# Patient Record
Sex: Female | Born: 1985 | Race: Black or African American | Hispanic: No | Marital: Single | Smoking: Current every day smoker
Health system: Southern US, Community
[De-identification: ages and names within clinical notes are randomized; demographics above are authoritative.]

## PROBLEM LIST (undated history)

## (undated) ENCOUNTER — Emergency Department (HOSPITAL_BASED_OUTPATIENT_CLINIC_OR_DEPARTMENT_OTHER)
Admission: EM | Disposition: A | Discharge status: Home / Self Care | Payer: No Typology Code available for payment source | Source: Home / Self Care

## (undated) HISTORY — PX: TUBAL LIGATION: SHX77

---

## 2010-05-23 ENCOUNTER — Emergency Department (HOSPITAL_BASED_OUTPATIENT_CLINIC_OR_DEPARTMENT_OTHER): Admission: EM | Admit: 2010-05-23 | Discharge: 2010-05-23 | Payer: Self-pay | Admitting: Emergency Medicine

## 2010-06-25 ENCOUNTER — Emergency Department (HOSPITAL_BASED_OUTPATIENT_CLINIC_OR_DEPARTMENT_OTHER): Admission: EM | Admit: 2010-06-25 | Discharge: 2010-06-26 | Payer: Self-pay | Admitting: Emergency Medicine

## 2010-07-06 ENCOUNTER — Emergency Department (HOSPITAL_BASED_OUTPATIENT_CLINIC_OR_DEPARTMENT_OTHER): Admission: EM | Admit: 2010-07-06 | Discharge: 2010-07-06 | Payer: Self-pay | Admitting: Emergency Medicine

## 2010-08-15 ENCOUNTER — Emergency Department (HOSPITAL_BASED_OUTPATIENT_CLINIC_OR_DEPARTMENT_OTHER): Admission: EM | Admit: 2010-08-15 | Discharge: 2010-08-15 | Payer: Self-pay | Admitting: Emergency Medicine

## 2010-10-28 ENCOUNTER — Emergency Department (HOSPITAL_BASED_OUTPATIENT_CLINIC_OR_DEPARTMENT_OTHER)
Admission: EM | Admit: 2010-10-28 | Discharge: 2010-10-28 | Payer: Self-pay | Source: Home / Self Care | Admitting: Emergency Medicine

## 2010-11-04 LAB — URINALYSIS, ROUTINE W REFLEX MICROSCOPIC
Bilirubin Urine: NEGATIVE
Hgb urine dipstick: NEGATIVE
Ketones, ur: NEGATIVE mg/dL
Nitrite: NEGATIVE
Protein, ur: NEGATIVE mg/dL
Specific Gravity, Urine: 1.024 (ref 1.005–1.030)
Urine Glucose, Fasting: NEGATIVE mg/dL
Urobilinogen, UA: 1 mg/dL (ref 0.0–1.0)
pH: 7.5 (ref 5.0–8.0)

## 2010-11-04 LAB — RPR: RPR Ser Ql: NONREACTIVE

## 2010-11-04 LAB — URINE MICROSCOPIC-ADD ON

## 2010-11-04 LAB — WET PREP, GENITAL
Trich, Wet Prep: NONE SEEN
Yeast Wet Prep HPF POC: NONE SEEN

## 2010-11-04 LAB — GC/CHLAMYDIA PROBE AMP, GENITAL
Chlamydia, DNA Probe: NEGATIVE
GC Probe Amp, Genital: NEGATIVE

## 2010-11-04 LAB — PREGNANCY, URINE: Preg Test, Ur: NEGATIVE

## 2011-01-01 LAB — URINALYSIS, ROUTINE W REFLEX MICROSCOPIC
Bilirubin Urine: NEGATIVE
Glucose, UA: NEGATIVE mg/dL
Hgb urine dipstick: NEGATIVE
Ketones, ur: NEGATIVE mg/dL
Nitrite: NEGATIVE
Protein, ur: NEGATIVE mg/dL
Specific Gravity, Urine: 1.027 (ref 1.005–1.030)
Urobilinogen, UA: 1 mg/dL (ref 0.0–1.0)
pH: 6.5 (ref 5.0–8.0)

## 2011-01-01 LAB — URINE CULTURE
Colony Count: NO GROWTH
Culture  Setup Time: 201110280732
Culture: NO GROWTH

## 2011-01-01 LAB — GC/CHLAMYDIA PROBE AMP, GENITAL
Chlamydia, DNA Probe: NEGATIVE
GC Probe Amp, Genital: NEGATIVE

## 2011-01-01 LAB — WET PREP, GENITAL
Trich, Wet Prep: NONE SEEN
Yeast Wet Prep HPF POC: NONE SEEN

## 2011-01-01 LAB — URINE MICROSCOPIC-ADD ON

## 2011-01-01 LAB — PREGNANCY, URINE: Preg Test, Ur: NEGATIVE

## 2011-01-02 LAB — GC/CHLAMYDIA PROBE AMP, GENITAL
Chlamydia, DNA Probe: NEGATIVE
Chlamydia, DNA Probe: NEGATIVE
GC Probe Amp, Genital: NEGATIVE
GC Probe Amp, Genital: NEGATIVE

## 2011-01-02 LAB — URINALYSIS, ROUTINE W REFLEX MICROSCOPIC
Bilirubin Urine: NEGATIVE
Bilirubin Urine: NEGATIVE
Glucose, UA: NEGATIVE mg/dL
Glucose, UA: NEGATIVE mg/dL
Hgb urine dipstick: NEGATIVE
Ketones, ur: NEGATIVE mg/dL
Ketones, ur: NEGATIVE mg/dL
Nitrite: NEGATIVE
Nitrite: NEGATIVE
Protein, ur: NEGATIVE mg/dL
Protein, ur: NEGATIVE mg/dL
Specific Gravity, Urine: 1.016 (ref 1.005–1.030)
Specific Gravity, Urine: 1.031 — ABNORMAL HIGH (ref 1.005–1.030)
Urobilinogen, UA: 0.2 mg/dL (ref 0.0–1.0)
Urobilinogen, UA: 1 mg/dL (ref 0.0–1.0)
pH: 7 (ref 5.0–8.0)
pH: 6 (ref 5.0–8.0)

## 2011-01-02 LAB — URINE MICROSCOPIC-ADD ON

## 2011-01-02 LAB — PREGNANCY, URINE
Preg Test, Ur: NEGATIVE
Preg Test, Ur: NEGATIVE

## 2011-01-02 LAB — WET PREP, GENITAL
Clue Cells Wet Prep HPF POC: NONE SEEN
Trich, Wet Prep: NONE SEEN
Trich, Wet Prep: NONE SEEN

## 2011-01-03 LAB — BASIC METABOLIC PANEL
CO2: 28 mEq/L (ref 19–32)
Chloride: 106 mEq/L (ref 96–112)
Creatinine, Ser: 0.6 mg/dL (ref 0.4–1.2)
GFR calc Af Amer: >60 mL/min (ref >60)
Potassium: 4.3 mEq/L (ref 3.5–5.1)

## 2011-01-03 LAB — URINALYSIS, ROUTINE W REFLEX MICROSCOPIC
Bilirubin Urine: NEGATIVE
Glucose, UA: NEGATIVE mg/dL
Ketones, ur: NEGATIVE mg/dL
Leukocytes, UA: NEGATIVE
Nitrite: NEGATIVE
Protein, ur: NEGATIVE mg/dL
Specific Gravity, Urine: 1.027 (ref 1.005–1.030)
Urobilinogen, UA: 1 mg/dL (ref 0.0–1.0)
pH: 6 (ref 5.0–8.0)

## 2011-01-03 LAB — CBC
HCT: 33.4 % — ABNORMAL LOW (ref 36.0–46.0)
MCH: 27.4 pg (ref 26.0–34.0)
MCV: 83 fL (ref 78.0–100.0)
Platelets: 373 10*3/uL (ref 150–400)
RBC: 4.03 MIL/uL (ref 3.87–5.11)
WBC: 6 10*3/uL (ref 4.0–10.5)

## 2011-01-03 LAB — DIFFERENTIAL
Eosinophils Absolute: 0.4 10*3/uL (ref 0.0–0.7)
Eosinophils Relative: 7 % — ABNORMAL HIGH (ref 0–5)
Lymphocytes Relative: 28 % (ref 12–46)
Lymphs Abs: 1.7 10*3/uL (ref 0.7–4.0)
Monocytes Absolute: 0.3 10*3/uL (ref 0.1–1.0)
Monocytes Relative: 5 % (ref 3–12)

## 2011-01-03 LAB — URINE CULTURE: Culture  Setup Time: 201108050113

## 2011-01-03 LAB — PREGNANCY, URINE: Preg Test, Ur: NEGATIVE

## 2011-01-03 LAB — URINE MICROSCOPIC-ADD ON

## 2011-01-18 ENCOUNTER — Emergency Department (HOSPITAL_BASED_OUTPATIENT_CLINIC_OR_DEPARTMENT_OTHER)
Admission: EM | Admit: 2011-01-18 | Discharge: 2011-01-18 | Disposition: A | Discharge status: Home / Self Care | Payer: Self-pay | Attending: Emergency Medicine | Admitting: Emergency Medicine

## 2011-01-18 DIAGNOSIS — N898 Other specified noninflammatory disorders of vagina: Secondary | ICD-10-CM | POA: Insufficient documentation

## 2011-01-18 DIAGNOSIS — Z202 Contact with and (suspected) exposure to infections with a predominantly sexual mode of transmission: Secondary | ICD-10-CM | POA: Insufficient documentation

## 2011-01-18 DIAGNOSIS — F17200 Nicotine dependence, unspecified, uncomplicated: Secondary | ICD-10-CM | POA: Insufficient documentation

## 2011-01-18 LAB — URINALYSIS, ROUTINE W REFLEX MICROSCOPIC
Bilirubin Urine: NEGATIVE
Glucose, UA: NEGATIVE mg/dL
Ketones, ur: NEGATIVE mg/dL
Nitrite: NEGATIVE
Specific Gravity, Urine: 1.027 (ref 1.005–1.030)
pH: 6.5 (ref 5.0–8.0)

## 2011-01-18 LAB — WET PREP, GENITAL
Trich, Wet Prep: NONE SEEN
Yeast Wet Prep HPF POC: NONE SEEN

## 2011-01-18 LAB — URINE MICROSCOPIC-ADD ON

## 2011-01-18 LAB — PREGNANCY, URINE: Preg Test, Ur: NEGATIVE

## 2011-01-20 LAB — GC/CHLAMYDIA PROBE AMP, GENITAL: GC Probe Amp, Genital: NEGATIVE

## 2011-04-12 ENCOUNTER — Emergency Department (HOSPITAL_BASED_OUTPATIENT_CLINIC_OR_DEPARTMENT_OTHER)
Admission: EM | Admit: 2011-04-12 | Discharge: 2011-04-12 | Disposition: A | Discharge status: Home / Self Care | Payer: Self-pay | Attending: Emergency Medicine | Admitting: Emergency Medicine

## 2011-04-12 DIAGNOSIS — R21 Rash and other nonspecific skin eruption: Secondary | ICD-10-CM | POA: Insufficient documentation

## 2011-04-12 DIAGNOSIS — B354 Tinea corporis: Secondary | ICD-10-CM | POA: Insufficient documentation

## 2012-01-29 ENCOUNTER — Encounter (HOSPITAL_BASED_OUTPATIENT_CLINIC_OR_DEPARTMENT_OTHER): Payer: Self-pay | Admitting: *Deleted

## 2012-01-29 ENCOUNTER — Emergency Department (HOSPITAL_BASED_OUTPATIENT_CLINIC_OR_DEPARTMENT_OTHER)
Admission: EM | Admit: 2012-01-29 | Discharge: 2012-01-29 | Disposition: A | Discharge status: Home / Self Care | Payer: Self-pay | Attending: Emergency Medicine | Admitting: Emergency Medicine

## 2012-01-29 DIAGNOSIS — B86 Scabies: Secondary | ICD-10-CM

## 2012-01-29 DIAGNOSIS — R21 Rash and other nonspecific skin eruption: Secondary | ICD-10-CM | POA: Insufficient documentation

## 2012-01-29 MED ORDER — PERMETHRIN 5 % EX CREA: TOPICAL_CREAM | CUTANEOUS | Status: AC

## 2012-01-29 NOTE — Discharge Instructions (Signed)
Scabies Scabies are small bugs (mites) that burrow under the skin and cause red bumps and severe itching. These bugs can only be seen with a microscope. Scabies are highly contagious. They can spread easily from person to person by direct contact. They are also spread through sharing clothing or linens that have the scabies mites living in them. It is not unusual for an entire family to become infected through shared towels, clothing, or bedding.  HOME CARE INSTRUCTIONS   Your caregiver may prescribe a cream or lotion to kill the mites. If this cream is prescribed; massage the cream into the entire area of the body from the neck to the bottom of both feet. Also massage the cream into the scalp and face if your child is less than 1 year old. Avoid the eyes and mouth.   Leave the cream on for 8 to12 hours. Do not wash your hands after application. Your child should bathe or shower after the 8 to 12 hour application period. Sometimes it is helpful to apply the cream to your child at right before bedtime.   One treatment is usually effective and will eliminate approximately 95% of infestations. For severe cases, your caregiver may decide to repeat the treatment in 1 week. Everyone in your household should be treated with one application of the cream.   New rashes or burrows should not appear after successful treatment within 24 to 48 hours; however the itching and rash may last for 2 to 4 weeks after successful treatment. If your symptoms persist longer than this, see your caregiver.   Your caregiver also may prescribe a medication to help with the itching or to help the rash go away more quickly.   Scabies can live on clothing or linens for up to 3 days. Your entire child's recently used clothing, towels, stuffed toys, and bed linens should be washed in hot water and then dried in a dryer for at least 20 minutes on high heat. Items that cannot be washed should be enclosed in a plastic bag for at least 3  days.   To help relieve itching, bathe your child in a cool bath or apply cool washcloths to the affected areas.   Your child may return to school after treatment with the prescribed cream.  SEEK MEDICAL CARE IF:   The itching persists longer than 4 weeks after treatment.   The rash spreads or becomes infected (the area has red blisters or yellow-tan crust).  Document Released: 10/06/2005 Document Revised: 09/25/2011 Document Reviewed: 02/14/2009 [employer] Patient Information 2012 [employer]. 

## 2012-01-29 NOTE — ED Provider Notes (Addendum)
History     CSN: [id number]  Arrival date & time 01/29/12  1248   First MD Initiated Contact with Patient 01/29/12 1307      Chief Complaint  Patient presents with  . Rash    (Consider location/radiation/quality/duration/timing/severity/associated sxs/prior treatment) Patient is a 26 y.o. female presenting with rash. The history is provided by the patient. No language interpreter was used.  Rash  This is a new problem. The current episode started 2 days ago. The problem has been gradually worsening. The problem is associated with nothing. There has been no fever. The rash is present on the abdomen, back, right hand, left arm, right foot and left foot. The pain is at a severity of 0/10. Associated symptoms include itching. She has tried nothing for the symptoms.  Pt 's child has scabies.  Pt complains of itching  History reviewed. No pertinent past medical history.  History reviewed. No pertinent past surgical history.  No family history on file.  History  Substance Use Topics  . Smoking status: Never Smoker   . Smokeless tobacco: Not on file  . Alcohol Use: No    OB History    Grav Para Term Preterm Abortions TAB SAB Ect Mult Living                  Review of Systems  Skin: Positive for itching and rash.  All other systems reviewed and are negative.    Allergies  Review of patient's allergies indicates no known allergies.  Home Medications  No current outpatient prescriptions on file.  BP 106/72  Pulse 84  Temp(Src) 98.4 F (36.9 C) (Oral)  Resp 20  SpO2 100%  Physical Exam  Vitals reviewed. Constitutional: She is oriented to person, place, and time. She appears well-developed and well-nourished.  HENT:  Head: Normocephalic.  Cardiovascular: Normal rate.   Pulmonary/Chest: Effort normal.  Musculoskeletal: Normal range of motion.       Burrows hands and feet  Neurological: She is alert and oriented to person, place, and time.  Skin: Rash noted.     ED Course  Procedures (including critical care time)  Labs Reviewed - No data to display No results found.   No diagnosis found.    MDM  elemite       Lonia Skinner [place], [place] 01/29/12 1319  Lonia Skinner [place], [place] 01/30/12 1501  Lonia Skinner [place], [place] 01/30/12 1502  Lonia Skinner [place], [place] 01/30/12 2025

## 2012-01-29 NOTE — ED Provider Notes (Signed)
Medical screening examination/treatment/procedure(s) were performed by non-physician practitioner and as supervising physician I was immediately available for consultation/collaboration.   Lyanne Co, MD 01/29/12 1325

## 2012-01-29 NOTE — ED Notes (Signed)
Rash for over a month. Son was treated for scabies. She used his medication but was not sure it would be strong enough.

## 2012-02-03 NOTE — ED Provider Notes (Signed)
Medical screening examination/treatment/procedure(s) were performed by non-physician practitioner and as supervising physician I was immediately available for consultation/collaboration.   Lyanne Co, MD 02/03/12 [phone]

## 2012-09-05 ENCOUNTER — Emergency Department (HOSPITAL_BASED_OUTPATIENT_CLINIC_OR_DEPARTMENT_OTHER)
Admission: EM | Admit: 2012-09-05 | Discharge: 2012-09-05 | Disposition: A | Discharge status: Home / Self Care | Payer: No Typology Code available for payment source | Attending: Emergency Medicine | Admitting: Emergency Medicine

## 2012-09-05 ENCOUNTER — Encounter (HOSPITAL_BASED_OUTPATIENT_CLINIC_OR_DEPARTMENT_OTHER): Payer: Self-pay | Admitting: Emergency Medicine

## 2012-09-05 DIAGNOSIS — Y9389 Activity, other specified: Secondary | ICD-10-CM | POA: Insufficient documentation

## 2012-09-05 DIAGNOSIS — Y92410 Unspecified street and highway as the place of occurrence of the external cause: Secondary | ICD-10-CM | POA: Insufficient documentation

## 2012-09-05 DIAGNOSIS — IMO0002 Reserved for concepts with insufficient information to code with codable children: Secondary | ICD-10-CM | POA: Insufficient documentation

## 2012-09-05 MED ORDER — CYCLOBENZAPRINE HCL 10 MG PO TABS: 10.0000 mg | ORAL_TABLET | Freq: Two times a day (BID) | ORAL | Status: DC | PRN
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
## 2012-09-15 NOTE — ED Provider Notes (Signed)
Medical screening examination/treatment/procedure(s) were performed by non-physician practitioner and as supervising physician I was immediately available for consultation/collaboration.   Suzi Roots, MD 09/15/12 0830

## 2013-10-30 ENCOUNTER — Emergency Department (HOSPITAL_BASED_OUTPATIENT_CLINIC_OR_DEPARTMENT_OTHER): Payer: Medicaid Other

## 2013-10-30 ENCOUNTER — Encounter (HOSPITAL_BASED_OUTPATIENT_CLINIC_OR_DEPARTMENT_OTHER): Payer: Self-pay | Admitting: Emergency Medicine

## 2013-10-30 ENCOUNTER — Emergency Department (HOSPITAL_BASED_OUTPATIENT_CLINIC_OR_DEPARTMENT_OTHER)
Admission: EM | Admit: 2013-10-30 | Discharge: 2013-10-30 | Discharge status: Against Medical Advice | Payer: Medicaid Other | Attending: Emergency Medicine | Admitting: Emergency Medicine

## 2013-10-30 DIAGNOSIS — S46909A Unspecified injury of unspecified muscle, fascia and tendon at shoulder and upper arm level, unspecified arm, initial encounter: Secondary | ICD-10-CM | POA: Insufficient documentation

## 2013-10-30 DIAGNOSIS — IMO0002 Reserved for concepts with insufficient information to code with codable children: Secondary | ICD-10-CM | POA: Insufficient documentation

## 2013-10-30 DIAGNOSIS — Z5320 Procedure and treatment not carried out because of patient's decision for unspecified reasons: Secondary | ICD-10-CM

## 2013-10-30 DIAGNOSIS — S0990XA Unspecified injury of head, initial encounter: Secondary | ICD-10-CM | POA: Insufficient documentation

## 2013-10-30 DIAGNOSIS — S4980XA Other specified injuries of shoulder and upper arm, unspecified arm, initial encounter: Secondary | ICD-10-CM | POA: Insufficient documentation

## 2013-10-30 DIAGNOSIS — Y92410 Unspecified street and highway as the place of occurrence of the external cause: Secondary | ICD-10-CM | POA: Insufficient documentation

## 2013-10-30 DIAGNOSIS — Y9389 Activity, other specified: Secondary | ICD-10-CM | POA: Insufficient documentation

## 2013-10-30 DIAGNOSIS — M25512 Pain in left shoulder: Secondary | ICD-10-CM

## 2013-10-30 DIAGNOSIS — Z5329 Procedure and treatment not carried out because of patient's decision for other reasons: Secondary | ICD-10-CM

## 2013-10-30 MED ORDER — HYDROCODONE-ACETAMINOPHEN 5-325 MG PO TABS
1.0000 | ORAL_TABLET | Freq: Once | ORAL | Status: AC
  Administered 2013-10-30: 1 via ORAL
  Filled 2013-10-30: qty 1

## 2013-10-30 MED ORDER — HYDROCODONE-ACETAMINOPHEN 5-325 MG PO TABS: 1.0000 | ORAL_TABLET | Freq: Four times a day (QID) | ORAL | Status: DC | PRN

## 2013-10-30 NOTE — Discharge Instructions (Signed)
Shoulder Pain The shoulder is the joint that connects your arms to your body. The bones that form the shoulder joint include the upper arm bone (humerus), the shoulder blade (scapula), and the collarbone (clavicle). The top of the humerus is shaped like a ball and fits into a rather flat socket on the scapula (glenoid cavity). A combination of muscles and strong, fibrous tissues that connect muscles to bones (tendons) support your shoulder joint and hold the ball in the socket. Small, fluid-filled sacs (bursae) are located in different areas of the joint. They act as cushions between the bones and the overlying soft tissues and help reduce friction between the gliding tendons and the bone as you move your arm. Your shoulder joint allows a wide range of motion in your arm. This range of motion allows you to do things like scratch your back or throw a ball. However, this range of motion also makes your shoulder more prone to pain from overuse and injury. Causes of shoulder pain can originate from both injury and overuse and usually can be grouped in the following four categories:  Redness, swelling, and pain (inflammation) of the tendon (tendinitis) or the bursae (bursitis).  Instability, such as a dislocation of the joint.  Inflammation of the joint (arthritis).  Broken bone (fracture). HOME CARE INSTRUCTIONS   Apply ice to the sore area.  Put ice in a plastic bag.  Place a towel between your skin and the bag.  Leave the ice on for 15-20 minutes, 03-04 times per day for the first 2 days.  Stop using cold packs if they do not help with the pain.  If you have a shoulder sling or immobilizer, wear it as long as your caregiver instructs. Only remove it to shower or bathe. Move your arm as little as possible, but keep your hand moving to prevent swelling.  Squeeze a soft ball or foam pad as much as possible to help prevent swelling.  Only take over-the-counter or prescription medicines for pain,  discomfort, or fever as directed by your caregiver. SEEK MEDICAL CARE IF:   Your shoulder pain increases, or new pain develops in your arm, hand, or fingers.  Your hand or fingers become cold and numb.  Your pain is not relieved with medicines. SEEK IMMEDIATE MEDICAL CARE IF:   Your arm, hand, or fingers are numb or tingling.  Your arm, hand, or fingers are significantly swollen or turn white or blue. MAKE SURE YOU:   Understand these instructions.  Will watch your condition.  Will get help right away if you are not doing well or get worse. Document Released: 07/16/2005 Document Revised: 06/30/2012 Document Reviewed: 09/20/2011 [facility] Patient Information 2014 [place], [place]. Headaches, Frequently Asked Questions MIGRAINE HEADACHES Q: What is migraine? What causes it? How can I treat it? A: Generally, migraine headaches begin as a dull ache. Then they develop into a constant, throbbing, and pulsating pain. You may experience pain at the temples. You may experience pain at the front or back of one or both sides of the head. The pain is usually accompanied by a combination of:  Nausea.  Vomiting.  Sensitivity to light and noise. Some people (about 15%) experience an aura (see below) before an attack. The cause of migraine is believed to be chemical reactions in the brain. Treatment for migraine may include over-the-counter or prescription medications. It may also include self-help techniques. These include relaxation training and biofeedback.  Q: What is an aura? A: About 15% of people  with migraine get an "aura". This is a sign of neurological symptoms that occur before a migraine headache. You may see wavy or jagged lines, dots, or flashing lights. You might experience tunnel vision or blind spots in one or both eyes. The aura can include visual or auditory hallucinations (something imagined). It may include disruptions in smell (such as strange odors), taste or touch. Other  symptoms include:  Numbness.  A "pins and needles" sensation.  Difficulty in recalling or speaking the correct word. These neurological events may last as long as 60 minutes. These symptoms will fade as the headache begins. Q: What is a trigger? A: Certain physical or environmental factors can lead to or "trigger" a migraine. These include:  Foods.  Hormonal changes.  Weather.  Stress. It is important to remember that triggers are different for everyone. To help prevent migraine attacks, you need to figure out which triggers affect you. Keep a headache diary. This is a good way to track triggers. The diary will help you talk to your healthcare professional about your condition. Q: Does weather affect migraines? A: Bright sunshine, hot, humid conditions, and drastic changes in barometric pressure may lead to, or "trigger," a migraine attack in some people. But studies have shown that weather does not act as a trigger for everyone with migraines. Q: What is the link between migraine and hormones? A: Hormones start and regulate many of your body's functions. Hormones keep your body in balance within a constantly changing environment. The levels of hormones in your body are unbalanced at times. Examples are during menstruation, pregnancy, or menopause. That can lead to a migraine attack. In fact, about three quarters of all women with migraine report that their attacks are related to the menstrual cycle.  Q: Is there an increased risk of stroke for migraine sufferers? A: The likelihood of a migraine attack causing a stroke is very remote. That is not to say that migraine sufferers cannot have a stroke associated with their migraines. In persons under age 72, the most common associated factor for stroke is migraine headache. But over the course of a person's normal life span, the occurrence of migraine headache may actually be associated with a reduced risk of dying from cerebrovascular disease  due to stroke.  Q: What are acute medications for migraine? A: Acute medications are used to treat the pain of the headache after it has started. Examples over-the-counter medications, NSAIDs, ergots, and triptans.  Q: What are the triptans? A: Triptans are the newest class of abortive medications. They are specifically targeted to treat migraine. Triptans are vasoconstrictors. They moderate some chemical reactions in the brain. The triptans work on receptors in your brain. Triptans help to restore the balance of a neurotransmitter called serotonin. Fluctuations in levels of serotonin are thought to be a main cause of migraine.  Q: Are over-the-counter medications for migraine effective? A: Over-the-counter, or "OTC," medications may be effective in relieving mild to moderate pain and associated symptoms of migraine. But you should see your caregiver before beginning any treatment regimen for migraine.  Q: What are preventive medications for migraine? A: Preventive medications for migraine are sometimes referred to as "prophylactic" treatments. They are used to reduce the frequency, severity, and length of migraine attacks. Examples of preventive medications include antiepileptic medications, antidepressants, beta-blockers, calcium channel blockers, and NSAIDs (nonsteroidal anti-inflammatory drugs). Q: Why are anticonvulsants used to treat migraine? A: During the past few years, there has been an increased interest in antiepileptic drugs  for the prevention of migraine. They are sometimes referred to as "anticonvulsants". Both epilepsy and migraine may be caused by similar reactions in the brain.  Q: Why are antidepressants used to treat migraine? A: Antidepressants are typically used to treat people with depression. They may reduce migraine frequency by regulating chemical levels, such as serotonin, in the brain.  Q: What alternative therapies are used to treat migraine? A: The term "alternative  therapies" is often used to describe treatments considered outside the scope of conventional Western medicine. Examples of alternative therapy include acupuncture, acupressure, and yoga. Another common alternative treatment is herbal therapy. Some herbs are believed to relieve headache pain. Always discuss alternative therapies with your caregiver before proceeding. Some herbal products contain arsenic and other toxins. TENSION HEADACHES Q: What is a tension-type headache? What causes it? How can I treat it? A: Tension-type headaches occur randomly. They are often the result of temporary stress, anxiety, fatigue, or anger. Symptoms include soreness in your temples, a tightening band-like sensation around your head (a "vice-like" ache). Symptoms can also include a pulling feeling, pressure sensations, and contracting head and neck muscles. The headache begins in your forehead, temples, or the back of your head and neck. Treatment for tension-type headache may include over-the-counter or prescription medications. Treatment may also include self-help techniques such as relaxation training and biofeedback. CLUSTER HEADACHES Q: What is a cluster headache? What causes it? How can I treat it? A: Cluster headache gets its name because the attacks come in groups. The pain arrives with little, if any, warning. It is usually on one side of the head. A tearing or bloodshot eye and a runny nose on the same side of the headache may also accompany the pain. Cluster headaches are believed to be caused by chemical reactions in the brain. They have been described as the most severe and intense of any headache type. Treatment for cluster headache includes prescription medication and oxygen. SINUS HEADACHES Q: What is a sinus headache? What causes it? How can I treat it? A: When a cavity in the bones of the face and skull (a sinus) becomes inflamed, the inflammation will cause localized pain. This condition is usually the  result of an allergic reaction, a tumor, or an infection. If your headache is caused by a sinus blockage, such as an infection, you will probably have a fever. An x-ray will confirm a sinus blockage. Your caregiver's treatment might include antibiotics for the infection, as well as antihistamines or decongestants.  REBOUND HEADACHES Q: What is a rebound headache? What causes it? How can I treat it? A: A pattern of taking acute headache medications too often can lead to a condition known as "rebound headache." A pattern of taking too much headache medication includes taking it more than 2 days per week or in excessive amounts. That means more than the label or a caregiver advises. With rebound headaches, your medications not only stop relieving pain, they actually begin to cause headaches. Doctors treat rebound headache by tapering the medication that is being overused. Sometimes your caregiver will gradually substitute a different type of treatment or medication. Stopping may be a challenge. Regularly overusing a medication increases the potential for serious side effects. Consult a caregiver if you regularly use headache medications more than 2 days per week or more than the label advises. ADDITIONAL QUESTIONS AND ANSWERS Q: What is biofeedback? A: Biofeedback is a self-help treatment. Biofeedback uses special equipment to monitor your body's involuntary physical responses. Biofeedback monitors:  Breathing.  Pulse.  Heart rate.  Temperature.  Muscle tension.  Brain activity. Biofeedback helps you refine and perfect your relaxation exercises. You learn to control the physical responses that are related to stress. Once the technique has been mastered, you do not need the equipment any more. Q: Are headaches hereditary? A: Four out of five (80%) of people that suffer report a family history of migraine. Scientists are not sure if this is genetic or a family predisposition. Despite the uncertainty,  a child has a 50% chance of having migraine if one parent suffers. The child has a 75% chance if both parents suffer.  Q: Can children get headaches? A: By the time they reach high school, most young people have experienced some type of headache. Many safe and effective approaches or medications can prevent a headache from occurring or stop it after it has begun.  Q: What type of doctor should I see to diagnose and treat my headache? A: Start with your primary caregiver. Discuss his or her experience and approach to headaches. Discuss methods of classification, diagnosis, and treatment. Your caregiver may decide to recommend you to a headache specialist, depending upon your symptoms or other physical conditions. Having diabetes, allergies, etc., may require a more comprehensive and inclusive approach to your headache. The National Head[employer] Foundation will provide, upon request, a list of [facility] physician members in your state. Document Released: 12/27/2003 Document Revised: 12/29/2011 Document Reviewed: 06/05/2008 [facility] Patient Information 2014 [place], [place]. Motor Vehicle Collision  It is common to have multiple bruises and sore muscles after a motor vehicle collision (MVC). These tend to feel worse for the first 24 hours. You may have the most stiffness and soreness over the first several hours. You may also feel worse when you wake up the first morning after your collision. After this point, you will usually begin to improve with each day. The speed of improvement often depends on the severity of the collision, the number of injuries, and the location and nature of these injuries. HOME CARE INSTRUCTIONS   Put ice on the injured area.  Put ice in a plastic bag.  Place a towel between your skin and the bag.  Leave the ice on for 15-20 minutes, 03-04 times a day.  Drink enough fluids to keep your urine clear or pale yellow. Do not drink alcohol.  Take a warm shower or bath once or twice a day.  This will increase blood flow to sore muscles.  You may return to activities as directed by your caregiver. Be careful when lifting, as this may aggravate neck or back pain.  Only take over-the-counter or prescription medicines for pain, discomfort, or fever as directed by your caregiver. Do not use aspirin. This may increase bruising and bleeding. SEEK IMMEDIATE MEDICAL CARE IF:  You have numbness, tingling, or weakness in the arms or legs.  You develop severe headaches not relieved with medicine.  You have severe neck pain, especially tenderness in the middle of the back of your neck.  You have changes in bowel or bladder control.  There is increasing pain in any area of the body.  You have shortness of breath, lightheadedness, dizziness, or fainting.  You have chest pain.  You feel sick to your stomach (nauseous), throw up (vomit), or sweat.  You have increasing abdominal discomfort.  There is blood in your urine, stool, or vomit.  You have pain in your shoulder (shoulder strap areas).  You feel your symptoms are getting worse.  MAKE SURE YOU:   Understand these instructions.  Will watch your condition.  Will get help right away if you are not doing well or get worse. Document Released: 10/06/2005 Document Revised: 12/29/2011 Document Reviewed: 03/05/2011 [facility] Patient Information 2014 [place], [place].
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
## 2013-12-16 ENCOUNTER — Encounter (HOSPITAL_BASED_OUTPATIENT_CLINIC_OR_DEPARTMENT_OTHER): Payer: Self-pay | Admitting: Emergency Medicine

## 2013-12-16 ENCOUNTER — Emergency Department (HOSPITAL_BASED_OUTPATIENT_CLINIC_OR_DEPARTMENT_OTHER)
Admission: EM | Admit: 2013-12-16 | Discharge: 2013-12-16 | Disposition: A | Discharge status: Home / Self Care | Payer: Medicaid Other | Attending: Emergency Medicine | Admitting: Emergency Medicine

## 2013-12-16 DIAGNOSIS — B9689 Other specified bacterial agents as the cause of diseases classified elsewhere: Secondary | ICD-10-CM | POA: Insufficient documentation

## 2013-12-16 DIAGNOSIS — Z79899 Other long term (current) drug therapy: Secondary | ICD-10-CM | POA: Insufficient documentation

## 2013-12-16 DIAGNOSIS — Z9851 Tubal ligation status: Secondary | ICD-10-CM | POA: Insufficient documentation

## 2013-12-16 DIAGNOSIS — B3731 Acute candidiasis of vulva and vagina: Secondary | ICD-10-CM | POA: Insufficient documentation

## 2013-12-16 DIAGNOSIS — N760 Acute vaginitis: Secondary | ICD-10-CM | POA: Insufficient documentation

## 2013-12-16 DIAGNOSIS — Z3202 Encounter for pregnancy test, result negative: Secondary | ICD-10-CM | POA: Insufficient documentation

## 2013-12-16 DIAGNOSIS — B373 Candidiasis of vulva and vagina: Secondary | ICD-10-CM | POA: Insufficient documentation

## 2013-12-16 DIAGNOSIS — A499 Bacterial infection, unspecified: Secondary | ICD-10-CM | POA: Insufficient documentation

## 2013-12-16 DIAGNOSIS — B379 Candidiasis, unspecified: Secondary | ICD-10-CM

## 2013-12-16 LAB — PREGNANCY, URINE: PREG TEST UR: NEGATIVE

## 2013-12-16 LAB — WET PREP, GENITAL: Trich, Wet Prep: NONE SEEN

## 2013-12-16 LAB — URINALYSIS, ROUTINE W REFLEX MICROSCOPIC
BILIRUBIN URINE: NEGATIVE
Glucose, UA: NEGATIVE mg/dL
Hgb urine dipstick: NEGATIVE
KETONES UR: NEGATIVE mg/dL
Leukocytes, UA: NEGATIVE
NITRITE: NEGATIVE
PH: 6.5 (ref 5.0–8.0)
Protein, ur: NEGATIVE mg/dL
Specific Gravity, Urine: 1.034 — ABNORMAL HIGH (ref 1.005–1.030)
UROBILINOGEN UA: 1 mg/dL (ref 0.0–1.0)

## 2013-12-16 LAB — GC/CHLAMYDIA PROBE AMP
CT PROBE, AMP APTIMA: NEGATIVE
GC PROBE AMP APTIMA: NEGATIVE

## 2013-12-16 MED ORDER — FLUCONAZOLE 50 MG PO TABS
150.0000 mg | ORAL_TABLET | Freq: Once | ORAL | Status: AC
  Administered 2013-12-16: 150 mg via ORAL
  Filled 2013-12-16 (×2): qty 1

## 2013-12-16 MED ORDER — METRONIDAZOLE 500 MG PO TABS: 500.0000 mg | ORAL_TABLET | Freq: Two times a day (BID) | ORAL | Status: DC

## 2013-12-16 NOTE — Discharge Instructions (Signed)
Bacterial Vaginosis °Bacterial vaginosis is a vaginal infection that occurs when the normal balance of bacteria in the vagina is disrupted. It results from an overgrowth of certain bacteria. This is the most common vaginal infection in women of childbearing age. Treatment is important to prevent complications, especially in pregnant women, as it can cause a premature delivery. °CAUSES  °Bacterial vaginosis is caused by an increase in harmful bacteria that are normally present in smaller amounts in the vagina. Several different kinds of bacteria can cause bacterial vaginosis. However, the reason that the condition develops is not fully understood. °RISK FACTORS °Certain activities or behaviors can put you at an increased risk of developing bacterial vaginosis, including: °· Having a new sex partner or multiple sex partners. °· Douching. °· Using an intrauterine device (IUD) for contraception. °Women do not get bacterial vaginosis from toilet seats, bedding, swimming pools, or contact with objects around them. °SIGNS AND SYMPTOMS  °Some women with bacterial vaginosis have no signs or symptoms. Common symptoms include: °· Grey vaginal discharge. °· A fishlike odor with discharge, especially after sexual intercourse. °· Itching or burning of the vagina and vulva. °· Burning or pain with urination. °DIAGNOSIS  °Your health care provider will take a medical history and examine the vagina for signs of bacterial vaginosis. A sample of vaginal fluid may be taken. Your health care provider will look at this sample under a microscope to check for bacteria and abnormal cells. A vaginal pH test may also be done.  °TREATMENT  °Bacterial vaginosis may be treated with antibiotic medicines. These may be given in the form of a pill or a vaginal cream. A second round of antibiotics may be prescribed if the condition comes back after treatment.  °HOME CARE INSTRUCTIONS  °· Only take over-the-counter or prescription medicines as  directed by your health care provider. °· If antibiotic medicine was prescribed, take it as directed. Make sure you finish it even if you start to feel better. °· Do not have sex until treatment is completed. °· Tell all sexual partners that you have a vaginal infection. They should see their health care provider and be treated if they have problems, such as a mild rash or itching. °· Practice safe sex by using condoms and only having one sex partner. °SEEK MEDICAL CARE IF:  °· Your symptoms are not improving after 3 days of treatment. °· You have increased discharge or pain. °· You have a fever. °MAKE SURE YOU:  °· Understand these instructions. °· Will watch your condition. °· Will get help right away if you are not doing well or get worse. °FOR MORE INFORMATION  °Centers for Disease Control and Prevention, Division of STD Prevention: www.cdc.gov/std °American Sexual Health Association (ASHA): www.ashastd.org  °Document Released: 10/06/2005 Document Revised: 07/27/2013 Document Reviewed: 05/18/2013 °ExitCare® Patient Information ©2014 [employer]. ° °Candida Infection, Adult °A candida infection (also called yeast, fungus and Monilia infection) is an overgrowth of yeast that can occur anywhere on the body. A yeast infection commonly occurs in warm, moist body areas. Usually, the infection remains localized but can spread to become a systemic infection. A yeast infection may be a sign of a more severe disease such as diabetes, leukemia, or AIDS. °A yeast infection can occur in both men and women. In women, Candida vaginitis is a vaginal infection. It is one of the most common causes of vaginitis. Men usually do not have symptoms or know they have an infection until other problems develop. Men may find   out they have a yeast infection because their sex partner has a yeast infection. Uncircumcised men are more likely to get a yeast infection than circumcised men. This is because the uncircumcised glans is not  exposed to air and does not remain as dry as that of a circumcised glans. Older adults may develop yeast infections around dentures. °CAUSES  °Women °· Antibiotics. °· Steroid medication taken for a long time. °· Being overweight (obese). °· Diabetes. °· Poor immune condition. °· Certain serious medical conditions. °· Immune suppressive medications for organ transplant patients. °· Chemotherapy. °· Pregnancy. °· Menstration. °· Stress and fatigue. °· Intravenous drug use. °· Oral contraceptives. °· Wearing tight-fitting clothes in the crotch area. °· Catching it from a sex partner who has a yeast infection. °· Spermicide. °· Intravenous, urinary, or other catheters. °Men °· Catching it from a sex partner who has a yeast infection. °· Having oral or anal sex with a person who has the infection. °· Spermicide. °· Diabetes. °· Antibiotics. °· Poor immune system. °· Medications that suppress the immune system. °· Intravenous drug use. °· Intravenous, urinary, or other catheters. °SYMPTOMS  °Women °· Thick, white vaginal discharge. °· Vaginal itching. °· Redness and swelling in and around the vagina. °· Irritation of the lips of the vagina and perineum. °· Blisters on the vaginal lips and perineum. °· Painful sexual intercourse. °· Low blood sugar (hypoglycemia). °· Painful urination. °· Bladder infections. °· Intestinal problems such as constipation, indigestion, bad breath, bloating, increase in gas, diarrhea, or loose stools. °Men °· Men may develop intestinal problems such as constipation, indigestion, bad breath, bloating, increase in gas, diarrhea, or loose stools. °· Dry, cracked skin on the penis with itching or discomfort. °· Jock itch. °· Dry, flaky skin. °· Athlete's foot. °· Hypoglycemia. °DIAGNOSIS  °Women °· A history and an exam are performed. °· The discharge may be examined under a microscope. °· A culture may be taken of the discharge. °Men °· A history and an exam are performed. °· Any discharge from  the penis or areas of cracked skin will be looked at under the microscope and cultured. °· Stool samples may be cultured. °TREATMENT  °Women °· Vaginal antifungal suppositories and creams. °· Medicated creams to decrease irritation and itching on the outside of the vagina. °· Warm compresses to the perineal area to decrease swelling and discomfort. °· Oral antifungal medications. °· Medicated vaginal suppositories or cream for repeated or recurrent infections. °· Wash and dry the irritation areas before applying the cream. °· Eating yogurt with lactobacillus may help with prevention and treatment. °· Sometimes painting the vagina with gentian violet solution may help if creams and suppositories do not work. °Men °· Antifungal creams and oral antifungal medications. °· Sometimes treatment must continue for 30 days after the symptoms go away to prevent recurrence. °HOME CARE INSTRUCTIONS  °Women °· Use cotton underwear and avoid tight-fitting clothing. °· Avoid colored, scented toilet paper and deodorant tampons or pads. °· Do not douche. °· Keep your diabetes under control. °· Finish all the prescribed medications. °· Keep your skin clean and dry. °· Consume milk or yogurt with lactobacillus active culture regularly. If you get frequent yeast infections and think that is what the infection is, there are over-the-counter medications that you can get. If the infection does not show healing in 3 days, talk to your caregiver. °· Tell your sex partner you have a yeast infection. Your partner may need treatment also, especially if your infection does   not clear up or recurs. °Men °· Keep your skin clean and dry. °· Keep your diabetes under control. °· Finish all prescribed medications. °· Tell your sex partner that you have a yeast infection so they can be treated if necessary. °SEEK MEDICAL CARE IF:  °· Your symptoms do not clear up or worsen in one week after treatment. °· You have an oral temperature above 102° F (38.9°  C). °· You have trouble swallowing or eating for a prolonged time. °· You develop blisters on and around your vagina. °· You develop vaginal bleeding and it is not your menstrual period. °· You develop abdominal pain. °· You develop intestinal problems as mentioned above. °· You get weak or lightheaded. °· You have painful or increased urination. °· You have pain during sexual intercourse. °MAKE SURE YOU:  °· Understand these instructions. °· Will watch your condition. °· Will get help right away if you are not doing well or get worse. °Document Released: 11/13/2004 Document Revised: 12/29/2011 Document Reviewed: 02/25/2010 °ExitCare® Patient Information ©2014 [employer]. ° °

## 2013-12-16 NOTE — ED Provider Notes (Signed)
CSN: [id number]     Arrival date & time 12/16/13  0509 History   First MD Initiated Contact with Patient 12/16/13 [phone]     Chief Complaint  Patient presents with  . Vaginal Pain     (Consider location/radiation/quality/duration/timing/severity/associated sxs/prior Treatment) HPI  This a 28 year old female who presents with vaginal discharge and pain with urination. Patient states that 3 weeks ago she was diagnosed with bacterial vaginosis. She took a full course of antibiotics minus 4 pills.  She states she got tested for STDs and " everything came back clean."  Patient reports worsening vaginal discharge over the last week. Patient also reports burning after urination. It occurs mostly when she wipes. She denies any hematuria. She denies any abdominal pain. She denies any frequency or urgency. She denies any new sexual partners since her last STD test. She's had a tubal ligation.  History reviewed. No pertinent past medical history. Past Surgical History  Procedure Laterality Date  . Cesarean section    . Tubal ligation     No family history on file. History  Substance Use Topics  . Smoking status: Never Smoker   . Smokeless tobacco: Not on file  . Alcohol Use: No   OB History   Grav Para Term Preterm Abortions TAB SAB Ect Mult Living                 Review of Systems  Constitutional: Negative for fever.  Respiratory: Negative for chest tightness and shortness of breath.   Cardiovascular: Negative for chest pain.  Gastrointestinal: Negative for nausea, vomiting and abdominal pain.  Genitourinary: Positive for dysuria and vaginal discharge. Negative for urgency, frequency, hematuria, vaginal bleeding, difficulty urinating and dyspareunia.  Musculoskeletal: Negative for back pain.  Neurological: Negative for headaches.  All other systems reviewed and are negative.      Allergies  Review of patient's allergies indicates no known allergies.  Home Medications   Current  Outpatient Rx  Name  Route  Sig  Dispense  Refill  . HYDROcodone-acetaminophen (NORCO) 5-325 MG per tablet   Oral   Take 1 tablet by mouth every 6 (six) hours as needed for moderate pain.   6 tablet   0   . metroNIDAZOLE (FLAGYL) 500 MG tablet   Oral   Take 1 tablet (500 mg total) by mouth 2 (two) times daily.   14 tablet   0    BP 103/56  Pulse 107  Temp(Src) 98.1 F (36.7 C) (Oral)  Resp 16  Ht 5\' 6"  (1.676 m)  Wt 184 lb (83.462 kg)  BMI 29.71 kg/m2  SpO2 96%  LMP 12/05/2013 Physical Exam  Nursing note and vitals reviewed. Constitutional: She is oriented to person, place, and time. She appears well-developed and well-nourished. No distress.  HENT:  Head: Normocephalic and atraumatic.  Cardiovascular: Normal rate and regular rhythm.   Pulmonary/Chest: Effort normal. No respiratory distress.  Abdominal: Soft. She exhibits no distension. There is no tenderness.  Genitourinary: Vagina normal.  No external lesions noted, normal vaginal vault, moderate amount of thin white discharge, no cervical motion tenderness, no adnexal tenderness  Neurological: She is alert and oriented to person, place, and time.  Skin: Skin is warm and dry.  Psychiatric: She has a normal mood and affect.    ED Course  Procedures (including critical care time) Labs Review Labs Reviewed  WET PREP, GENITAL - Abnormal; Notable for the following:    Yeast Wet Prep HPF POC TOO NUMEROUS TO COUNT (*)  Clue Cells Wet Prep HPF POC TOO NUMEROUS TO COUNT (*)    WBC, Wet Prep HPF POC MANY (*)    All other components within normal limits  URINALYSIS, ROUTINE W REFLEX MICROSCOPIC - Abnormal; Notable for the following:    Specific Gravity, Urine 1.034 (*)    All other components within normal limits  GC/CHLAMYDIA PROBE AMP  PREGNANCY, URINE   Imaging Review No results found.  EKG Interpretation   None       MDM   Final diagnoses:  Yeast infection  Bacterial vaginosis    Patient presents  with pain following urination and vaginal discharge. She denies any abdominal pain. Exam is notable for moderate amount of thin white discharge.  Urinalysis negative. Patient has evidence of both bacterial vaginosis and yeast infection. Patient was given Diflucan. She'll be given a second course of metronidazole. Patient is to followup with her primary physician if symptoms persist.  After history, exam, and medical workup I feel the patient has been appropriately medically screened and is safe for discharge home. Pertinent diagnoses were discussed with the patient. Patient was given return precautions.     Shon Batonourtney F Horton, MD 12/16/13 [phone]

## 2013-12-16 NOTE — ED Notes (Signed)
Pt reports vaginal d/c and itching x 3 days

## 2014-06-04 ENCOUNTER — Emergency Department (HOSPITAL_COMMUNITY): Payer: No Typology Code available for payment source

## 2014-06-04 ENCOUNTER — Emergency Department (HOSPITAL_COMMUNITY)
Admission: EM | Admit: 2014-06-04 | Discharge: 2014-06-04 | Disposition: A | Discharge status: Home / Self Care | Payer: No Typology Code available for payment source | Attending: Emergency Medicine | Admitting: Emergency Medicine

## 2014-06-04 ENCOUNTER — Encounter (HOSPITAL_COMMUNITY): Payer: Self-pay | Admitting: Emergency Medicine

## 2014-06-04 DIAGNOSIS — S99929A Unspecified injury of unspecified foot, initial encounter: Secondary | ICD-10-CM

## 2014-06-04 DIAGNOSIS — S8990XA Unspecified injury of unspecified lower leg, initial encounter: Secondary | ICD-10-CM | POA: Insufficient documentation

## 2014-06-04 DIAGNOSIS — Y9389 Activity, other specified: Secondary | ICD-10-CM | POA: Diagnosis not present

## 2014-06-04 DIAGNOSIS — F17200 Nicotine dependence, unspecified, uncomplicated: Secondary | ICD-10-CM | POA: Diagnosis not present

## 2014-06-04 DIAGNOSIS — Y92410 Unspecified street and highway as the place of occurrence of the external cause: Secondary | ICD-10-CM | POA: Diagnosis not present

## 2014-06-04 DIAGNOSIS — S8001XA Contusion of right knee, initial encounter: Secondary | ICD-10-CM

## 2014-06-04 DIAGNOSIS — S8000XA Contusion of unspecified knee, initial encounter: Secondary | ICD-10-CM | POA: Insufficient documentation

## 2014-06-04 DIAGNOSIS — S99919A Unspecified injury of unspecified ankle, initial encounter: Secondary | ICD-10-CM

## 2014-06-04 MED ORDER — HYDROCODONE-ACETAMINOPHEN 5-325 MG PO TABS: 1.0000 | ORAL_TABLET | ORAL | Status: DC | PRN

## 2014-06-04 MED ORDER — OXYCODONE-ACETAMINOPHEN 5-325 MG PO TABS
1.0000 | ORAL_TABLET | Freq: Once | ORAL | Status: AC
  Administered 2014-06-04: 1 via ORAL
  Filled 2014-06-04: qty 1

## 2014-06-04 MED ORDER — CYCLOBENZAPRINE HCL 10 MG PO TABS: 10.0000 mg | ORAL_TABLET | Freq: Three times a day (TID) | ORAL | Status: DC | PRN
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
## 2014-06-04 NOTE — ED Notes (Signed)
Patient states she is having pain to both legs and to left arm. Patient is able to move all extremities at this time.
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
## 2014-06-04 NOTE — ED Provider Notes (Signed)
Medical screening examination/treatment/procedure(s) were performed by non-physician practitioner and as supervising physician I was immediately available for consultation/collaboration.   EKG Interpretation None       Joquan Lotz M Zacaria Pousson, MD 06/04/14 1750 

## 2014-06-28 ENCOUNTER — Emergency Department (HOSPITAL_BASED_OUTPATIENT_CLINIC_OR_DEPARTMENT_OTHER)
Admission: EM | Admit: 2014-06-28 | Discharge: 2014-06-28 | Discharge status: Against Medical Advice | Payer: Medicaid Other | Attending: Emergency Medicine | Admitting: Emergency Medicine

## 2014-06-28 ENCOUNTER — Encounter (HOSPITAL_BASED_OUTPATIENT_CLINIC_OR_DEPARTMENT_OTHER): Payer: Self-pay | Admitting: Emergency Medicine

## 2014-06-28 DIAGNOSIS — F17200 Nicotine dependence, unspecified, uncomplicated: Secondary | ICD-10-CM | POA: Diagnosis not present

## 2014-06-28 DIAGNOSIS — N898 Other specified noninflammatory disorders of vagina: Secondary | ICD-10-CM | POA: Insufficient documentation

## 2014-06-28 LAB — URINALYSIS, ROUTINE W REFLEX MICROSCOPIC
Bilirubin Urine: NEGATIVE
Glucose, UA: NEGATIVE mg/dL
KETONES UR: NEGATIVE mg/dL
LEUKOCYTES UA: NEGATIVE
NITRITE: NEGATIVE
PH: 5.5 (ref 5.0–8.0)
Protein, ur: NEGATIVE mg/dL
Specific Gravity, Urine: 1.03 (ref 1.005–1.030)
UROBILINOGEN UA: 0.2 mg/dL (ref 0.0–1.0)

## 2014-06-28 LAB — URINE MICROSCOPIC-ADD ON

## 2014-06-28 LAB — PREGNANCY, URINE: Preg Test, Ur: NEGATIVE

## 2014-06-28 NOTE — ED Notes (Signed)
C/o vaginal d/c x 3 days

## 2015-06-01 ENCOUNTER — Emergency Department (HOSPITAL_BASED_OUTPATIENT_CLINIC_OR_DEPARTMENT_OTHER)
Admission: EM | Admit: 2015-06-01 | Discharge: 2015-06-01 | Disposition: A | Discharge status: Home / Self Care | Payer: Medicaid Other | Attending: Emergency Medicine | Admitting: Emergency Medicine

## 2015-06-01 ENCOUNTER — Encounter (HOSPITAL_BASED_OUTPATIENT_CLINIC_OR_DEPARTMENT_OTHER): Payer: Self-pay | Admitting: Emergency Medicine

## 2015-06-01 DIAGNOSIS — R197 Diarrhea, unspecified: Secondary | ICD-10-CM | POA: Diagnosis not present

## 2015-06-01 DIAGNOSIS — R109 Unspecified abdominal pain: Secondary | ICD-10-CM | POA: Diagnosis not present

## 2015-06-01 DIAGNOSIS — Z720 Tobacco use: Secondary | ICD-10-CM | POA: Insufficient documentation

## 2015-06-01 MED ORDER — ONDANSETRON HCL 8 MG PO TABS
4.0000 mg | ORAL_TABLET | Freq: Once | ORAL | Status: AC
  Administered 2015-06-01: 4 mg via ORAL
  Filled 2015-06-01: qty 1

## 2015-06-01 NOTE — ED Provider Notes (Signed)
CSN: [id number]     Arrival date & time 06/01/15  0805 History   First MD Initiated Contact with Patient 06/01/15 [phone]     Chief Complaint  Patient presents with  . Diarrhea     (Consider location/radiation/quality/duration/timing/severity/associated sxs/prior Treatment) Patient is a 29 y.o. female presenting with diarrhea.  Diarrhea Quality:  Watery and mucous Severity:  Moderate Onset quality:  Sudden Duration:  10 hours Timing:  Intermittent Progression:  Unchanged Relieved by:  Nothing Worsened by:  Nothing tried Associated symptoms: abdominal pain (cramping)   Associated symptoms: no fever and no vomiting   Associated symptoms comment:  Nausea   No past medical history on file. Past Surgical History  Procedure Laterality Date  . Cesarean section    . Tubal ligation     No family history on file. Social History  Substance Use Topics  . Smoking status: Current Every Day Smoker -- 0.50 packs/day    Types: Cigarettes  . Smokeless tobacco: None  . Alcohol Use: Yes     Comment: socially   OB History    No data available     Review of Systems  Constitutional: Negative for fever.  Gastrointestinal: Positive for abdominal pain (cramping) and diarrhea. Negative for vomiting.  All other systems reviewed and are negative.     Allergies  Review of patient's allergies indicates no known allergies.  Home Medications   Prior to Admission medications   Not on File   BP 110/64 mmHg  Pulse 62  Temp(Src) 98.3 F (36.8 C) (Oral)  Resp 16  Ht  (1.676 m)  Wt 165 lb (74.844 kg)  BMI 26.64 kg/m2  SpO2 100%  LMP 06/01/2015 Physical Exam  Constitutional: She is oriented to person, place, and time. She appears well-developed and well-nourished. No distress.  HENT:  Head: Normocephalic and atraumatic.  Eyes: Conjunctivae are normal. No scleral icterus.  Neck: Neck supple.  Cardiovascular: Normal rate and intact distal pulses.   Pulmonary/Chest: Effort normal.  No stridor. No respiratory distress.  Abdominal: Soft. Normal appearance. She exhibits no distension. There is no tenderness. There is no rebound and no guarding.  Neurological: She is alert and oriented to person, place, and time.  Skin: Skin is warm and dry. No rash noted.  Psychiatric: She has a normal mood and affect. Her behavior is normal.  Nursing note and vitals reviewed.   ED Course  Procedures (including critical care time) Labs Review Labs Reviewed  C DIFFICILE QUICK SCAN W PCR REFLEX      EKG Interpretation None      MDM   Final diagnoses:  Diarrhea    Diarrhea since last night. She thinks it might be secondary to food that she ate. Likely a self limited acute diarrheal illness.  However, she is exposed to C diff at work and took abx last week.  Will check c diff, but have overall low suspicion.  Otherwise well appearing and stable for discharge.     Blake Divine, MD 06/01/15 [phone]

## 2015-06-01 NOTE — ED Notes (Signed)
Diarrhea since 10 pm last night.  Approximately 6 episodes.  Watery stools.  Some nausea.  Some cramping prior to diarrhea.  No fever.  No blood in stool.

## 2015-06-01 NOTE — Discharge Instructions (Signed)

## 2016-12-02 ENCOUNTER — Emergency Department (HOSPITAL_BASED_OUTPATIENT_CLINIC_OR_DEPARTMENT_OTHER)
Admission: EM | Admit: 2016-12-02 | Discharge: 2016-12-02 | Disposition: A | Discharge status: Home / Self Care | Payer: No Typology Code available for payment source | Attending: Emergency Medicine | Admitting: Emergency Medicine

## 2016-12-02 ENCOUNTER — Encounter (HOSPITAL_BASED_OUTPATIENT_CLINIC_OR_DEPARTMENT_OTHER): Payer: Self-pay | Admitting: *Deleted

## 2016-12-02 DIAGNOSIS — Y92410 Unspecified street and highway as the place of occurrence of the external cause: Secondary | ICD-10-CM | POA: Insufficient documentation

## 2016-12-02 DIAGNOSIS — R51 Headache: Secondary | ICD-10-CM | POA: Insufficient documentation

## 2016-12-02 DIAGNOSIS — R519 Headache, unspecified: Secondary | ICD-10-CM

## 2016-12-02 DIAGNOSIS — Y999 Unspecified external cause status: Secondary | ICD-10-CM | POA: Insufficient documentation

## 2016-12-02 DIAGNOSIS — M545 Low back pain, unspecified: Secondary | ICD-10-CM

## 2016-12-02 DIAGNOSIS — Y9389 Activity, other specified: Secondary | ICD-10-CM | POA: Insufficient documentation

## 2016-12-02 DIAGNOSIS — F17210 Nicotine dependence, cigarettes, uncomplicated: Secondary | ICD-10-CM | POA: Insufficient documentation
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
## 2016-12-02 NOTE — Discharge Instructions (Signed)
Read the information below.  You may return to the Emergency Department at any time for worsening condition or any new symptoms that concern you.    If you develop fevers, loss of control of bowel or bladder, weakness or numbness in your legs, or are unable to walk, return to the ER for a recheck.  °

## 2016-12-02 NOTE — ED Notes (Signed)
Pt. Came to RN and said she is leaving and was told she had been discharged with no papers in hand.  RN Earlene Plateravis gave papers to Pt. And in chart formerly discharged the Pt. To home.
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
## 2017-06-26 ENCOUNTER — Encounter (HOSPITAL_BASED_OUTPATIENT_CLINIC_OR_DEPARTMENT_OTHER): Payer: Self-pay | Admitting: *Deleted

## 2017-06-26 DIAGNOSIS — F17210 Nicotine dependence, cigarettes, uncomplicated: Secondary | ICD-10-CM | POA: Diagnosis not present

## 2017-06-26 DIAGNOSIS — K611 Rectal abscess: Secondary | ICD-10-CM | POA: Insufficient documentation

## 2017-06-26 DIAGNOSIS — L0231 Cutaneous abscess of buttock: Secondary | ICD-10-CM | POA: Diagnosis present

## 2017-06-26 NOTE — ED Triage Notes (Signed)
Abscess to her rectal area for a month. It has been draining. She is having her menses and the pad is causing more pain.

## 2017-06-27 ENCOUNTER — Emergency Department (HOSPITAL_BASED_OUTPATIENT_CLINIC_OR_DEPARTMENT_OTHER)
Admission: EM | Admit: 2017-06-27 | Discharge: 2017-06-27 | Disposition: A | Discharge status: Home / Self Care | Payer: Medicaid Other | Attending: Emergency Medicine | Admitting: Emergency Medicine

## 2017-06-27 ENCOUNTER — Emergency Department (HOSPITAL_BASED_OUTPATIENT_CLINIC_OR_DEPARTMENT_OTHER): Payer: Medicaid Other

## 2017-06-27 DIAGNOSIS — K611 Rectal abscess: Secondary | ICD-10-CM

## 2017-06-27 LAB — BASIC METABOLIC PANEL
ANION GAP: 6 (ref 5–15)
BUN: 11 mg/dL (ref 6–20)
CALCIUM: 8.5 mg/dL — AB (ref 8.9–10.3)
CO2: 25 mmol/L (ref 22–32)
Chloride: 106 mmol/L (ref 101–111)
Creatinine, Ser: 0.55 mg/dL (ref 0.44–1.00)
GFR calc non Af Amer: >60 mL/min (ref >60)
Glucose, Bld: 98 mg/dL (ref 65–99)
Potassium: 3.5 mmol/L (ref 3.5–5.1)
SODIUM: 137 mmol/L (ref 135–145)

## 2017-06-27 LAB — CBC WITH DIFFERENTIAL/PLATELET
BASOS PCT: 0 %
Basophils Absolute: 0 10*3/uL (ref 0.0–0.1)
EOS ABS: 0.5 10*3/uL (ref 0.0–0.7)
Eosinophils Relative: 5 %
HCT: 29.3 % — ABNORMAL LOW (ref 36.0–46.0)
Hemoglobin: 9.5 g/dL — ABNORMAL LOW (ref 12.0–15.0)
Lymphocytes Relative: 24 %
Lymphs Abs: 2.4 10*3/uL (ref 0.7–4.0)
MCH: 25.2 pg — ABNORMAL LOW (ref 26.0–34.0)
MCHC: 32.4 g/dL (ref 30.0–36.0)
MCV: 77.7 fL — ABNORMAL LOW (ref 78.0–100.0)
Monocytes Absolute: 0.8 10*3/uL (ref 0.1–1.0)
Monocytes Relative: 8 %
NEUTROS PCT: 63 %
Neutro Abs: 6.1 10*3/uL (ref 1.7–7.7)
Platelets: 386 10*3/uL (ref 150–400)
RBC: 3.77 MIL/uL — ABNORMAL LOW (ref 3.87–5.11)
RDW: 16.4 % — ABNORMAL HIGH (ref 11.5–15.5)
WBC: 9.7 10*3/uL (ref 4.0–10.5)

## 2017-06-27 LAB — PREGNANCY, URINE: PREG TEST UR: NEGATIVE

## 2017-06-27 MED ORDER — IOPAMIDOL (ISOVUE-300) INJECTION 61%
100.0000 mL | Freq: Once | INTRAVENOUS | Status: AC | PRN
  Administered 2017-06-27: 100 mL via INTRAVENOUS

## 2017-06-27 MED ORDER — FENTANYL CITRATE (PF) 100 MCG/2ML IJ SOLN
50.0000 ug | Freq: Once | INTRAMUSCULAR | Status: AC
  Administered 2017-06-27: 50 ug via INTRAVENOUS
  Filled 2017-06-27: qty 2

## 2017-06-27 MED ORDER — AMOXICILLIN-POT CLAVULANATE 875-125 MG PO TABS: 1.0000 | ORAL_TABLET | Freq: Two times a day (BID) | ORAL | 0 refills | Status: AC

## 2017-06-27 MED ORDER — SODIUM CHLORIDE 0.9 % IV SOLN
3.0000 g | Freq: Once | INTRAVENOUS | Status: AC
  Administered 2017-06-27: 3 g via INTRAVENOUS
  Filled 2017-06-27: qty 3

## 2017-06-27 MED ORDER — DOXYCYCLINE HYCLATE 100 MG PO CAPS: 100.0000 mg | ORAL_CAPSULE | Freq: Two times a day (BID) | ORAL | 0 refills | Status: AC

## 2017-06-27 MED ORDER — HYDROCODONE-ACETAMINOPHEN 5-325 MG PO TABS: 1.0000 | ORAL_TABLET | Freq: Four times a day (QID) | ORAL | 0 refills | Status: AC | PRN

## 2017-06-27 NOTE — ED Notes (Signed)
Pt verbalizes understanding of d/c instructions and denies any further needs at this time. 

## 2017-06-27 NOTE — ED Notes (Signed)
Pt called for vital signs and states "All you're doing is getting my vitals? That's stupid. I'm not dead. How much longer? I've never had to wait over an hour here before." Pt updated that this is an ER and we're doing the best we can.

## 2017-06-27 NOTE — ED Provider Notes (Signed)
[facility]   CSN: [id number] Arrival date & time: 06/26/17  2112     History   Chief Complaint Chief Complaint  Patient presents with  . Abscess    HPI Katie Hardy is a 31 y.o. female.  The history is provided by the patient.  Abscess  Location:  Pelvis Ano-genital abscess location: buttock. Abscess quality: induration and painful   Duration:  1 month Progression:  Worsening Pain details:    Quality:  Dull   Severity:  Moderate   Timing:  Constant   Progression:  Worsening Chronicity:  New Context: not diabetes   Relieved by:  Nothing Exacerbated by: palpation. Associated symptoms: no fever and no vomiting   pt with abscess to rectal area for a month It gets worse when she is having menstrual cycle bleeding due to have using pads No fever/vomiting No abd pain No h/o diabetes    PMH - none Past Surgical History:  Procedure Laterality Date  . CESAREAN SECTION    . TUBAL LIGATION      OB History    No data available       Home Medications    Prior to Admission medications   Not on File    Family History No family history on file.  Social History Social History  Substance Use Topics  . Smoking status: Current Every Day Smoker    Packs/day: 0.50    Types: Cigarettes  . Smokeless tobacco: Never Used  . Alcohol use Yes     Comment: socially     Allergies   Patient has no known allergies.   Review of Systems Review of Systems  Constitutional: Negative for fever.  Gastrointestinal: Negative for abdominal pain and vomiting.  Genitourinary: Positive for menstrual problem.  All other systems reviewed and are negative.    Physical Exam Updated Vital Signs BP 122/78 (BP Location: Right Arm)   Pulse 73   Temp 99.1 F (37.3 C) (Oral)   Resp 18   Ht 1.676 m (5\' 6" )   Wt 93 kg (205 lb)   LMP 06/25/2017   SpO2 100%   BMI 33.09 kg/m   Physical Exam CONSTITUTIONAL: Well developed/well nourished HEAD:  Normocephalic/atraumatic EYES: EOMI ENMT: Mucous membranes moist NECK: supple no meningeal signs CV: S1/S2 noted, no murmurs/rubs/gallops noted LUNGS: Lungs are clear to auscultation bilaterally, no apparent distress ABDOMEN: soft, nontender  Rectal - diffuse tenderness/erythema to buttocks that extends into perineum.  No drainage, no crepitus Tenderness left inguinal region Nurse Diane present for exam GU:no cva tenderness NEURO: Pt is awake/alert/appropriate, moves all extremitiesx4.  No facial droop.   EXTREMITIES: pulses normal/equal, full ROM SKIN: warm, color normal PSYCH: no abnormalities of mood noted, alert and oriented to situation   ED Treatments / Results  Labs (all labs ordered are listed, but only abnormal results are displayed) Labs Reviewed  BASIC METABOLIC PANEL  CBC WITH DIFFERENTIAL/PLATELET  PREGNANCY, URINE    EKG  EKG Interpretation None       Radiology No results found.  Procedures Procedures (including critical care time)  Medications Ordered in ED Medications  fentaNYL (SUBLIMAZE) injection 50 mcg (50 mcg Intravenous Given 06/27/17 0229)  Ampicillin-Sulbactam (UNASYN) 3 g in sodium chloride 0.9 % 100 mL IVPB (0 g Intravenous Stopped 06/27/17 0305)  iopamidol (ISOVUE-300) 61 % injection 100 mL (100 mLs Intravenous Contrast Given 06/27/17 0319)     Initial Impression / Assessment and Plan / ED Course  I have reviewed the triage  vital signs and the nursing notes.  Pertinent labs results that were available during my care of the patient were reviewed by me and considered in my medical decision making (see chart for details). Narcotic database reviewed and considered in decision making     Pt will need CT imaging to evaluate for perirectal abscess   5:04 AM Pt improved She is resting comfortably Vitals appropriate No new complaints After discussion of CT findings, pt prefers to go home and trial oral antibiotics This seems reasonable as pt  is nontoxic, no h/o diabetes No fistula or abscess noted on CT imagine  Will start augmentin/doxycycline to cover MRSA as well as any other flora  We discussed appropriate skin care  We discussed extensively ED return precautions   Final Clinical Impressions(s) / ED Diagnoses   Final diagnoses:  Perirectal cellulitis    New Prescriptions New Prescriptions   AMOXICILLIN-CLAVULANATE (AUGMENTIN) 875-125 MG TABLET    Take 1 tablet by mouth 2 (two) times daily. One po bid x 7 days   DOXYCYCLINE (VIBRAMYCIN) 100 MG CAPSULE    Take 1 capsule (100 mg total) by mouth 2 (two) times daily. One po bid x 7 days   HYDROCODONE-ACETAMINOPHEN (NORCO/VICODIN) 5-325 MG TABLET    Take 1 tablet by mouth every 6 (six) hours as needed for severe pain.     Zadie Rhine, MD 06/27/17 [phone]

## 2019-03-15 IMAGING — CT CT PELVIS W/ CM
2 of 4 series · 15 of 46 positions shown, 17 images · IV contrast (iopamidol)
Comparison: None.

CLINICAL DATA: Abscess to the rectal area for a month, more on the
left than right. Abdominal pain and fever.

EXAM:
CT PELVIS WITH CONTRAST
TECHNIQUE: Multidetector CT imaging of the pelvis was performed using the
standard protocol following the bolus administration of intravenous
contrast.
CONTRAST:  100mL EGPUM4-XVV IOPAMIDOL (EGPUM4-XVV) INJECTION 61%

[Series 5: coronal st · coronal · 0.52mm/px · 3 of 153 slices shown]
[im 51/153  soft-tissue]
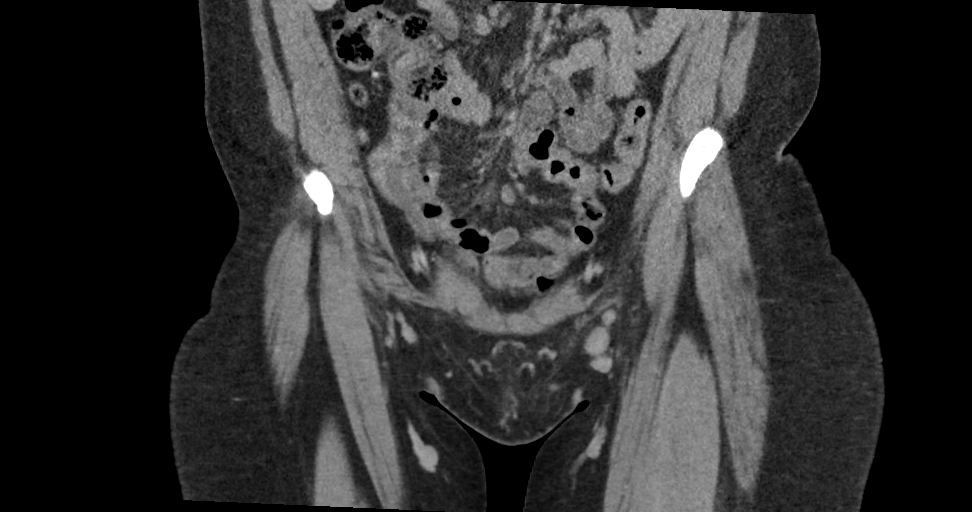
[im 68/153  soft-tissue]
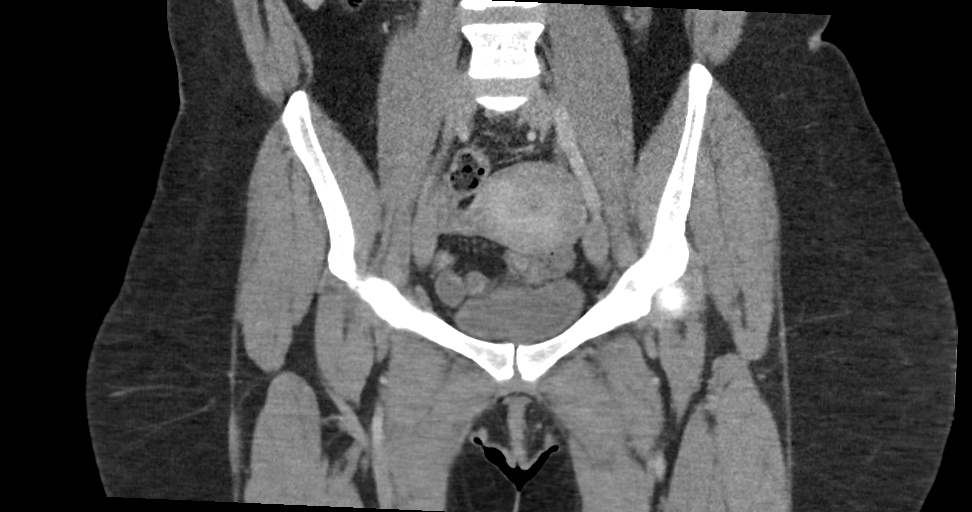
[im 85/153  soft-tissue]
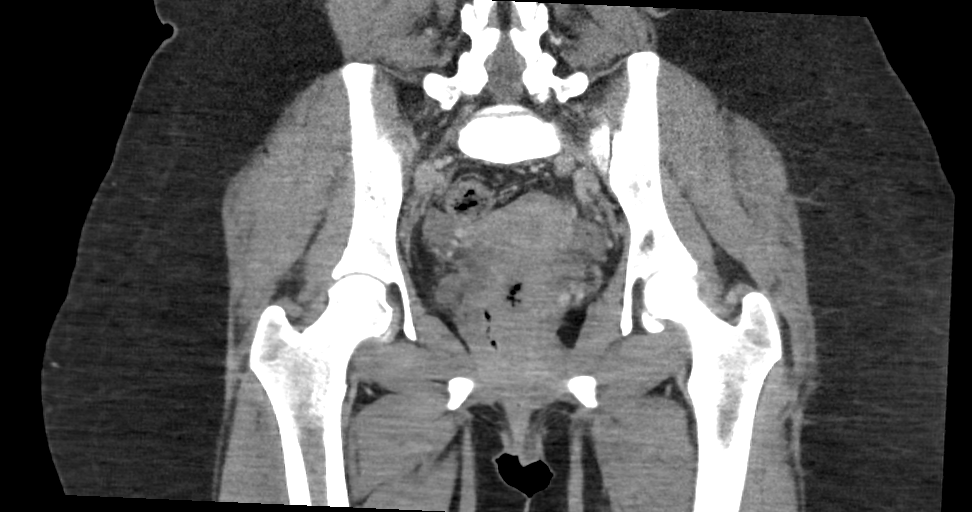

[Series 9: axial soft tissue · axial · 0.80mm/px · z∈[-624,-390]mm · 12 of 135 slices shown, 14 images]
[im 9/135  soft-tissue]
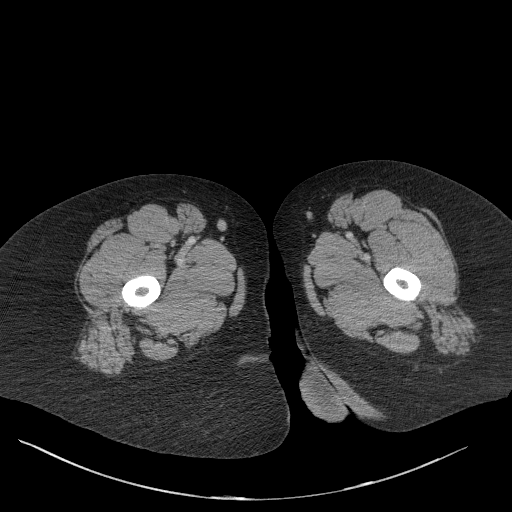
[im 9/135  bone]
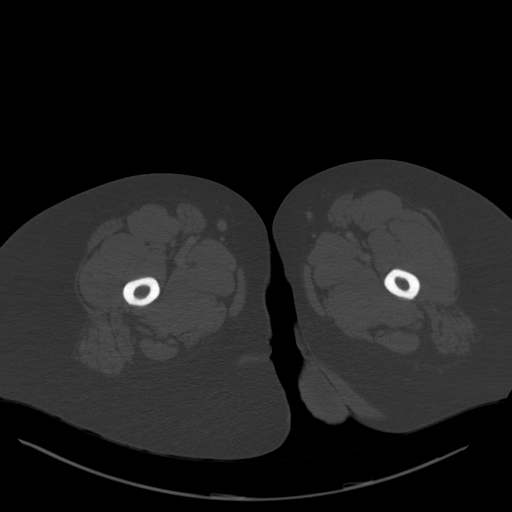
[im 18/135  soft-tissue]
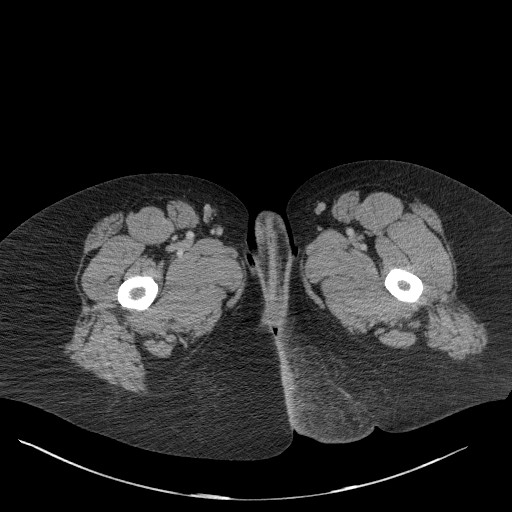
[im 31/135  soft-tissue]
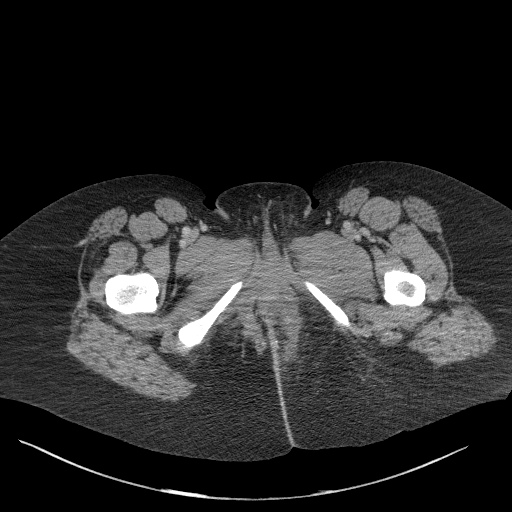
[im 39/135  soft-tissue]
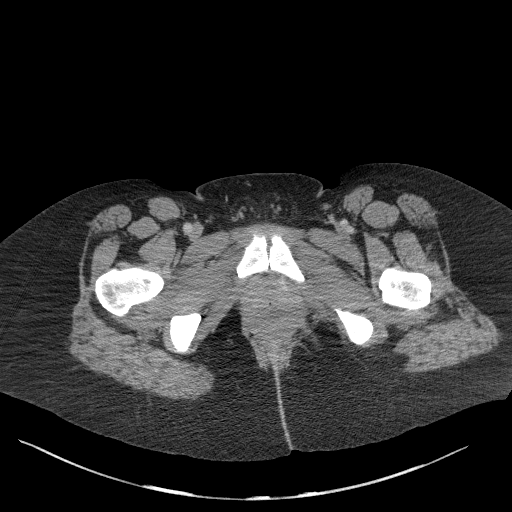
[im 52/135  soft-tissue]
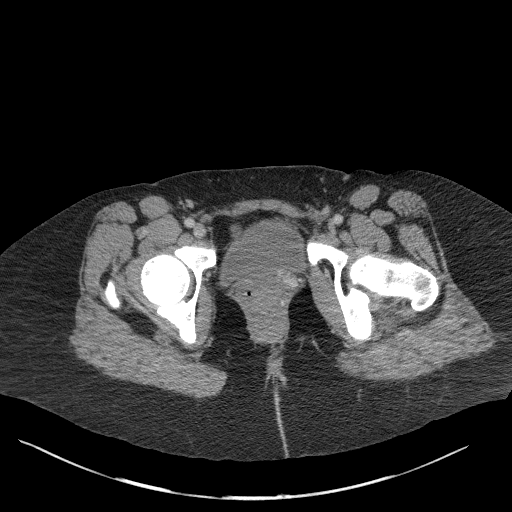
[im 61/135  soft-tissue]
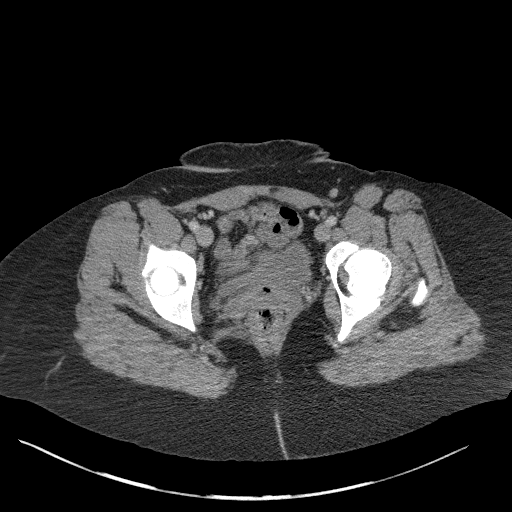
[im 74/135  soft-tissue]
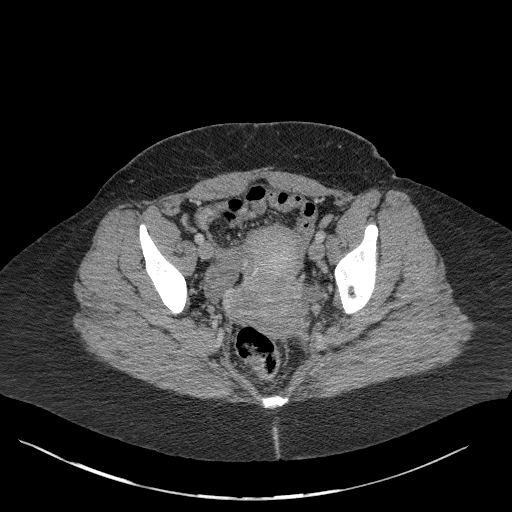
[im 83/135  soft-tissue]
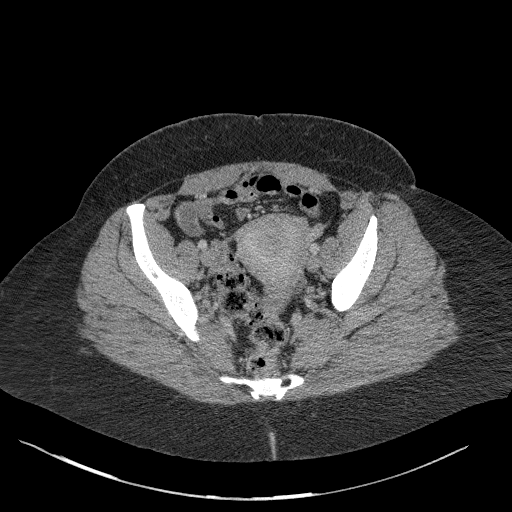
[im 96/135  soft-tissue]
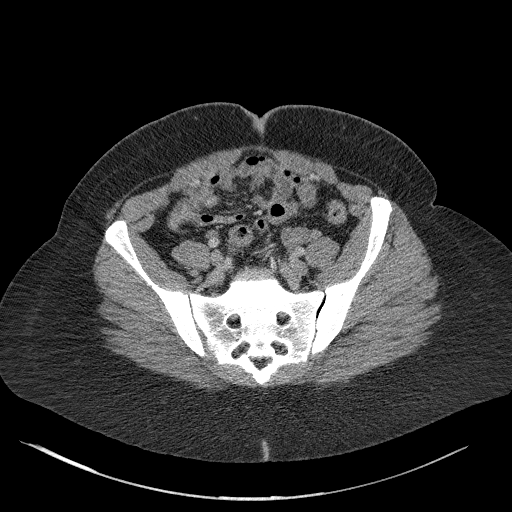
[im 96/135  bone]
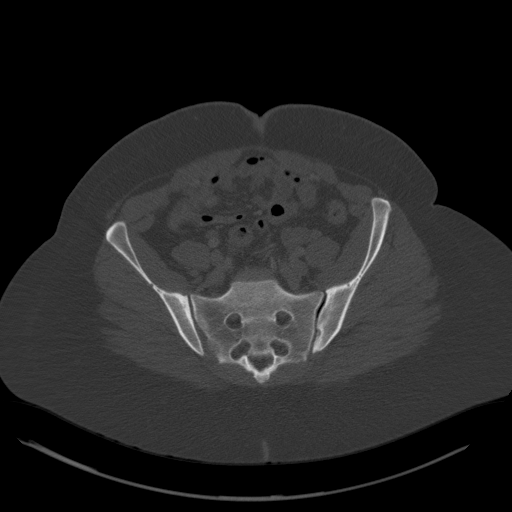
[im 104/135  soft-tissue]
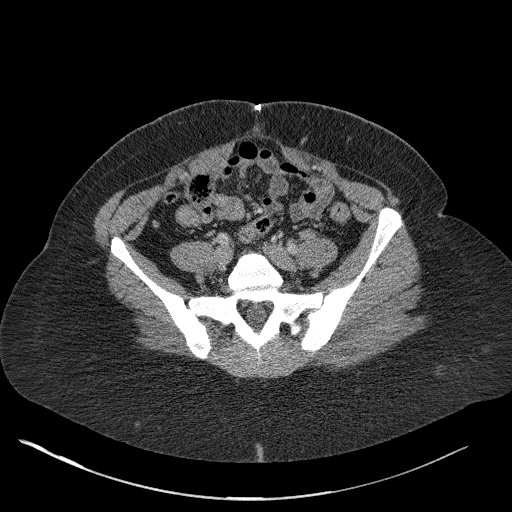
[im 117/135  soft-tissue]
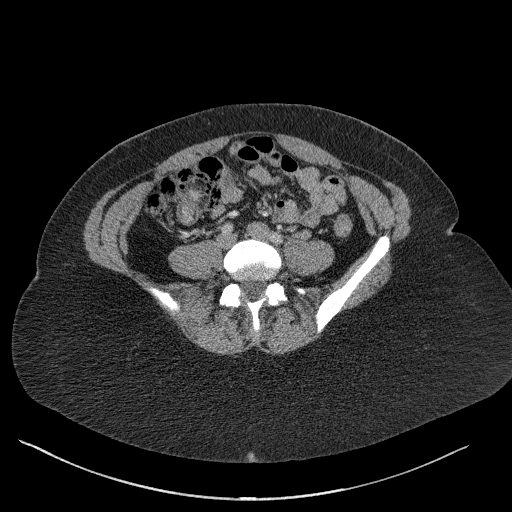
[im 126/135  soft-tissue]
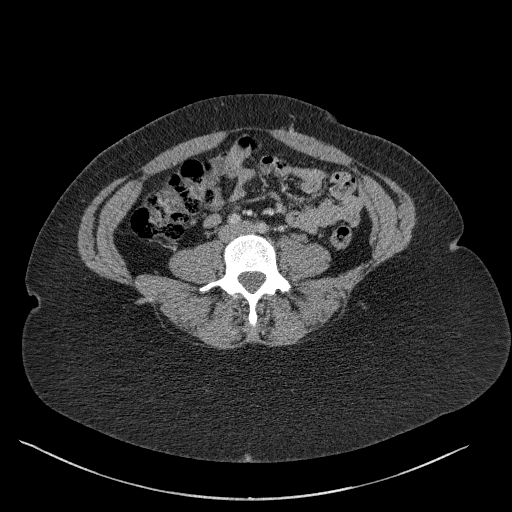

[15 of 46 positions shown; findings below may reference images not displayed]

FINDINGS: Urinary Tract: No bladder wall thickening or filling defect. Distal
ureters are not dilated.

Bowel: Visualized pelvic small and large bowel are not abnormally
distended and no inflammatory changes are appreciated. The appendix
is normal.

Vascular/Lymphatic: No pathologically enlarged lymph nodes. No
significant vascular abnormality seen.

Reproductive:  Uterus and ovaries are not enlarged.

Other: There is infiltration in the subcutaneous fat of the left
perineal region in the ischiorectal fossa extending to the left
gluteal crease. Mild infiltration suggested in the right perineal
region. Changes are consistent with cellulitis. No loculated
collection to suggest abscess. No definite fistula identified.

Musculoskeletal: No suspicious bone lesions identified.
IMPRESSION: Infiltration in the left greater than right perineal fat extending
to the left gluteal crease. Changes consistent with perirectal
cellulitis. No discrete abscess identified.

## 2023-06-11 ENCOUNTER — Other Ambulatory Visit: Payer: Self-pay

## 2023-09-21 ENCOUNTER — Emergency Department (HOSPITAL_BASED_OUTPATIENT_CLINIC_OR_DEPARTMENT_OTHER)
Admission: EM | Admit: 2023-09-21 | Discharge: 2023-09-21 | Disposition: A | Discharge status: Home / Self Care | Payer: No Typology Code available for payment source | Attending: Emergency Medicine | Admitting: Emergency Medicine

## 2023-09-21 ENCOUNTER — Encounter (HOSPITAL_BASED_OUTPATIENT_CLINIC_OR_DEPARTMENT_OTHER): Payer: Self-pay | Admitting: Urology

## 2023-09-21 ENCOUNTER — Other Ambulatory Visit: Payer: Self-pay

## 2023-09-21 ENCOUNTER — Emergency Department (HOSPITAL_BASED_OUTPATIENT_CLINIC_OR_DEPARTMENT_OTHER): Payer: No Typology Code available for payment source

## 2023-09-21 DIAGNOSIS — F17210 Nicotine dependence, cigarettes, uncomplicated: Secondary | ICD-10-CM | POA: Diagnosis not present

## 2023-09-21 DIAGNOSIS — R0602 Shortness of breath: Secondary | ICD-10-CM | POA: Insufficient documentation

## 2023-09-21 DIAGNOSIS — R0789 Other chest pain: Secondary | ICD-10-CM | POA: Insufficient documentation

## 2023-09-21 LAB — BASIC METABOLIC PANEL WITH GFR
Anion gap: 9 (ref 5–15)
BUN: 15 mg/dL (ref 6–20)
CO2: 25 mmol/L (ref 22–32)
Calcium: 9.4 mg/dL (ref 8.9–10.3)
Chloride: 101 mmol/L (ref 98–111)
Creatinine, Ser: 0.68 mg/dL (ref 0.44–1.00)
GFR, Estimated: >60 mL/min
Glucose, Bld: 87 mg/dL (ref 70–99)
Potassium: 3.9 mmol/L (ref 3.5–5.1)
Sodium: 135 mmol/L (ref 135–145)

## 2023-09-21 LAB — CBC
HCT: 38.5 % (ref 36.0–46.0)
Hemoglobin: 12.7 g/dL (ref 12.0–15.0)
MCH: 29.6 pg (ref 26.0–34.0)
MCHC: 33 g/dL (ref 30.0–36.0)
MCV: 89.7 fL (ref 80.0–100.0)
Platelets: 359 10*3/uL (ref 150–400)
RBC: 4.29 MIL/uL (ref 3.87–5.11)
RDW: 13.7 % (ref 11.5–15.5)
WBC: 7.4 10*3/uL (ref 4.0–10.5)
nRBC: 0 % (ref 0.0–0.2)

## 2023-09-21 LAB — TROPONIN I (HIGH SENSITIVITY): Troponin I (High Sensitivity): 2 ng/L

## 2023-09-21 LAB — PREGNANCY, URINE: Preg Test, Ur: NEGATIVE

## 2023-09-21 MED ORDER — ALUM & MAG HYDROXIDE-SIMETH 200-200-20 MG/5ML PO SUSP
30.0000 mL | Freq: Once | ORAL | Status: AC
  Administered 2023-09-21: 30 mL via ORAL
  Filled 2023-09-21: qty 30

## 2023-09-21 NOTE — ED Triage Notes (Signed)
Pt states central chest pain, worse with deep breath and cough  States started 2 weeks ago  States SOB ,Denies N/V

## 2023-09-21 NOTE — ED Provider Notes (Signed)
Katie Hardy Provider Note   CSN: [id number] Arrival date & time: 09/21/23  1452     History  Chief Complaint  Patient presents with   Chest Pain    Katie Hardy is a 37 y.o. female otherwise healthy presents with 2-week onset of chest pain and intermittent shortness of breath.  Symptoms came on gradually and have come and gone.  Denies any relieving factors, however she does note that her symptoms appear to be worse if she coughs or sneezes.  Denies any congestion, fevers, chills nausea or vomiting.  No dizziness or syncopal episodes.  No exertional component.  No cardiac history.  No history of blood clot, no calf pain or swelling.  No recent surgery or extended travel.  She is not on birth control.  She has had acid reflux in the past and she does note the symptoms feel similar.  Does admit to smoking cigarettes regularly   Chest Pain      Home Medications Prior to Admission medications   Medication Sig Start Date End Date Taking? Authorizing Provider  amoxicillin-clavulanate (AUGMENTIN) 875-125 MG tablet Take 1 tablet by mouth 2 (two) times daily. One po bid x 7 days 06/27/17   Zadie Rhine, MD  doxycycline (VIBRAMYCIN) 100 MG capsule Take 1 capsule (100 mg total) by mouth 2 (two) times daily. One po bid x 7 days 06/27/17   Zadie Rhine, MD  HYDROcodone-acetaminophen (NORCO/VICODIN) 5-325 MG tablet Take 1 tablet by mouth every 6 (six) hours as needed for severe pain. 06/27/17   Zadie Rhine, MD      Allergies    Hydrocodone    Review of Systems   Review of Systems  Cardiovascular:  Positive for chest pain.    Physical Exam Updated Vital Signs BP (!) 129/94 (BP Location: Left Arm)   Pulse (!) 53   Temp 98.5 F (36.9 C) (Oral)   Resp 16   Ht 5\' 6"  (1.676 m)   Wt 93 kg   LMP 09/16/2023   SpO2 100%   BMI 33.09 kg/m  Physical Exam Vitals and nursing note reviewed.  Constitutional:      General: She is not in  acute distress.    Appearance: She is well-developed.  HENT:     Head: Normocephalic and atraumatic.  Eyes:     Conjunctiva/sclera: Conjunctivae normal.  Cardiovascular:     Rate and Rhythm: Normal rate and regular rhythm.     Heart sounds: No murmur heard. Pulmonary:     Effort: Pulmonary effort is normal. No respiratory distress.     Breath sounds: Normal breath sounds. No wheezing, rhonchi or rales.  Chest:     Chest wall: No tenderness.  Abdominal:     Palpations: Abdomen is soft.     Tenderness: There is no abdominal tenderness.  Musculoskeletal:        General: No swelling.     Cervical back: Neck supple.     Right lower leg: No tenderness. No edema.     Left lower leg: No tenderness. No edema.  Skin:    General: Skin is warm and dry.     Capillary Refill: Capillary refill takes less than 2 seconds.  Neurological:     Mental Status: She is alert.  Psychiatric:        Mood and Affect: Mood normal.     ED Results / Procedures / Treatments   Labs (all labs ordered are listed, but only abnormal results  are displayed) Labs Reviewed  BASIC METABOLIC PANEL  CBC  PREGNANCY, URINE  TROPONIN I (HIGH SENSITIVITY)    EKG EKG Interpretation Date/Time:  Monday September 21 2023 15:01:18 EST Ventricular Rate:  65 PR Interval:  168 QRS Duration:  82 QT Interval:  392 QTC Calculation: 408 R Axis:   57  Text Interpretation: Sinus rhythm Consider left ventricular hypertrophy Anterior Q waves, possibly due to LVH No old tracing to compare Confirmed by Benjiman Core [phone]) on 09/21/2023 4:53:32 PM  Radiology No results found.  Procedures Procedures    Medications Ordered in ED Medications  alum & mag hydroxide-simeth (MAALOX/MYLANTA) 200-200-20 MG/5ML suspension 30 mL (30 mLs Oral Given 09/21/23 1707)    ED Course/ Medical Decision Making/ A&P                                 Medical Decision Making Amount and/or Complexity of Data Reviewed Labs:  ordered. Radiology: ordered.   This patient presents to the ED with chief complaint(s) of chest pain with pertinent past medical history of acid reflux.  The complaint involves an extensive differential diagnosis and also carries with it a high risk of complications and morbidity.    The differential diagnosis includes  ACS, PE, AAA, GERD, costochondritis The initial plan is to  Will start with basic labs, EKG and chest x-ray Additional history obtained: No additional historians or records utilized  Initial Assessment:   Patient is hemodynamically stable, exam is entirely benign, she is PERC negative, low heart score, overall low suspicion for ACS, PE, AAA.  No reproducible chest tenderness, low suspicion for costochondritis or musculoskeletal etiology.  She does have a history of acid reflux and reports similar symptoms.  Will give GI cocktail while awaiting final chest x-ray reading.  Independent ECG interpretation:  Normal sinus rhythm without ischemic changes  Independent labs interpretation:  The following labs were independently interpreted:  Troponins without elevation, CBC and BMP unremarkable  Independent visualization and interpretation of imaging: I independently visualized the following imaging with scope of interpretation limited to determining acute life threatening conditions related to emergency care: Chest x-ray, which revealed without cardiopulmonary disease  Treatment and Reassessment: Given GI cocktail following initial assessment  Upon reassessment at 5:30 PM patient states she feels fine, discussed with her that her blood work and EKG are all unremarkable but we are still waiting on the x-ray.  She states she would like to go home.  Her x-ray appears to be completely clear to me and I feel comfortable with this.  I have told her to create a MyChart account to be able to view these to follow-up with her primary care physician in the next 3 to 5 days for further  evaluation. Consultations obtained:   None  Disposition:   Patient will be discharged home with close PCP follow-up. The patient has been appropriately medically screened and/or stabilized in the ED. I have low suspicion for any other emergent medical condition which would require further screening, evaluation or treatment in the ED or require inpatient management. At time of discharge the patient is hemodynamically stable and in no acute distress. I have discussed work-up results and diagnosis with patient and answered all questions. Patient is agreeable with discharge plan. We discussed strict return precautions for returning to the emergency department and they verbalized understanding.     Social Determinants of Health:   none  Final Clinical Impression(s) / ED Diagnoses Final diagnoses:  Atypical chest pain    Rx / DC Orders ED Discharge Orders     None         Fabienne Bruns 09/21/23 1744    Benjiman Core, MD 09/21/23 2207

## 2023-09-21 NOTE — ED Notes (Signed)
Patient transported to X-ray 

## 2023-09-21 NOTE — Discharge Instructions (Addendum)
It was a pleasure taking care of you this evening.  You were evaluated in the emergency room for chest pain.  Your lab work was all normal.  As discussed the results for your x-ray are still pending.  Please create a MyChart account to be able to view these.  Please call back if you are unable to view your results by tomorrow.  Please follow-up with your primary care physician in the next 3 to 5 days for further evaluation.  If you experience any new or worsening symptoms including dizziness, worsening chest pain or severe difficulty breathing please return to the emergency room.

## 2024-05-21 ENCOUNTER — Emergency Department (HOSPITAL_BASED_OUTPATIENT_CLINIC_OR_DEPARTMENT_OTHER)

## 2024-05-21 ENCOUNTER — Emergency Department (HOSPITAL_BASED_OUTPATIENT_CLINIC_OR_DEPARTMENT_OTHER): Admission: EM | Admit: 2024-05-21 | Discharge: 2024-05-21 | Disposition: A | Discharge status: Home / Self Care

## 2024-05-21 ENCOUNTER — Encounter (HOSPITAL_BASED_OUTPATIENT_CLINIC_OR_DEPARTMENT_OTHER): Payer: Self-pay | Admitting: Emergency Medicine

## 2024-05-21 ENCOUNTER — Other Ambulatory Visit: Payer: Self-pay

## 2024-05-21 DIAGNOSIS — S8992XA Unspecified injury of left lower leg, initial encounter: Secondary | ICD-10-CM | POA: Diagnosis present

## 2024-05-21 DIAGNOSIS — S80212A Abrasion, left knee, initial encounter: Secondary | ICD-10-CM | POA: Diagnosis not present

## 2024-05-21 DIAGNOSIS — Y92410 Unspecified street and highway as the place of occurrence of the external cause: Secondary | ICD-10-CM | POA: Insufficient documentation

## 2024-05-21 DIAGNOSIS — M545 Low back pain, unspecified: Secondary | ICD-10-CM | POA: Diagnosis not present

## 2024-05-21 DIAGNOSIS — R001 Bradycardia, unspecified: Secondary | ICD-10-CM | POA: Insufficient documentation

## 2024-05-21 MED ORDER — METHOCARBAMOL 500 MG PO TABS: 500.0000 mg | ORAL_TABLET | Freq: Two times a day (BID) | ORAL | 0 refills | Status: AC | PRN

## 2024-05-21 MED ORDER — LIDOCAINE 5 % EX PTCH: 1.0000 | MEDICATED_PATCH | CUTANEOUS | 0 refills | Status: AC

## 2024-05-21 MED ORDER — LIDOCAINE 5 % EX PTCH
1.0000 | MEDICATED_PATCH | Freq: Once | CUTANEOUS | Status: DC
  Administered 2024-05-21: 1 via TRANSDERMAL
  Filled 2024-05-21: qty 1
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
## 2024-05-21 NOTE — Discharge Instructions (Addendum)
 May take Tylenol  turning with ibuprofen for pain.  We are prescribing lidocaine  patches and muscle relaxers as well.  As discussed please return if develop severe headache, vision loss, facial droop, chest pain, shortness of breath, abdominal pain, inability eat or drink due to nausea vomiting, stop having bowel movements, passout or you develop any new or worsening symptoms that are concerning to you.
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
